# Patient Record
Sex: Male | Born: 1935 | Race: White | Hispanic: No | Marital: Married | Smoking: Current every day smoker
Health system: Southern US, Community
[De-identification: ages and names within clinical notes are randomized; demographics above are authoritative.]

## PROBLEM LIST (undated history)

## (undated) DIAGNOSIS — R1310 Dysphagia, unspecified: Secondary | ICD-10-CM

## (undated) DIAGNOSIS — J3800 Paralysis of vocal cords and larynx, unspecified: Secondary | ICD-10-CM

## (undated) DIAGNOSIS — I10 Essential (primary) hypertension: Secondary | ICD-10-CM

## (undated) DIAGNOSIS — J449 Chronic obstructive pulmonary disease, unspecified: Secondary | ICD-10-CM

## (undated) DIAGNOSIS — I499 Cardiac arrhythmia, unspecified: Secondary | ICD-10-CM

## (undated) DIAGNOSIS — K219 Gastro-esophageal reflux disease without esophagitis: Secondary | ICD-10-CM

## (undated) DIAGNOSIS — C801 Malignant (primary) neoplasm, unspecified: Secondary | ICD-10-CM

## (undated) DIAGNOSIS — I639 Cerebral infarction, unspecified: Secondary | ICD-10-CM

## (undated) DIAGNOSIS — E785 Hyperlipidemia, unspecified: Secondary | ICD-10-CM

## (undated) DIAGNOSIS — Z87442 Personal history of urinary calculi: Secondary | ICD-10-CM

## (undated) DIAGNOSIS — M1990 Unspecified osteoarthritis, unspecified site: Secondary | ICD-10-CM

## (undated) HISTORY — DX: Gastro-esophageal reflux disease without esophagitis: K21.9

## (undated) HISTORY — PX: OTHER SURGICAL HISTORY: SHX169

## (undated) HISTORY — PX: PEG TUBE PLACEMENT: SUR1034

## (undated) HISTORY — DX: Hyperlipidemia, unspecified: E78.5

## (undated) HISTORY — PX: APPENDECTOMY: SHX54

## (undated) HISTORY — PX: HERNIA REPAIR: SHX51

## (undated) HISTORY — DX: Cerebral infarction, unspecified: I63.9

## (undated) HISTORY — DX: Essential (primary) hypertension: I10

## (undated) HISTORY — DX: Malignant (primary) neoplasm, unspecified: C80.1

---

## 2006-03-06 ENCOUNTER — Other Ambulatory Visit: Payer: Self-pay

## 2006-03-06 ENCOUNTER — Inpatient Hospital Stay: Payer: Self-pay | Admitting: Internal Medicine

## 2006-06-20 ENCOUNTER — Encounter: Payer: Self-pay | Admitting: Family Medicine

## 2006-06-27 ENCOUNTER — Encounter: Payer: Self-pay | Admitting: Family Medicine

## 2007-03-18 ENCOUNTER — Ambulatory Visit: Payer: Self-pay | Admitting: Cardiovascular Disease

## 2007-03-27 ENCOUNTER — Ambulatory Visit: Payer: Self-pay | Admitting: Family Medicine

## 2007-04-30 ENCOUNTER — Ambulatory Visit: Payer: Self-pay

## 2008-11-02 ENCOUNTER — Ambulatory Visit: Payer: Self-pay | Admitting: Family Medicine

## 2008-11-08 ENCOUNTER — Ambulatory Visit: Payer: Self-pay | Admitting: Family Medicine

## 2009-04-19 ENCOUNTER — Ambulatory Visit: Payer: Self-pay | Admitting: Family Medicine

## 2011-01-02 ENCOUNTER — Ambulatory Visit: Payer: Self-pay | Admitting: Family Medicine

## 2011-11-11 DIAGNOSIS — C649 Malignant neoplasm of unspecified kidney, except renal pelvis: Secondary | ICD-10-CM | POA: Insufficient documentation

## 2011-12-11 DIAGNOSIS — N1330 Unspecified hydronephrosis: Secondary | ICD-10-CM | POA: Insufficient documentation

## 2012-02-21 DIAGNOSIS — N400 Enlarged prostate without lower urinary tract symptoms: Secondary | ICD-10-CM | POA: Insufficient documentation

## 2012-02-24 DIAGNOSIS — I4891 Unspecified atrial fibrillation: Secondary | ICD-10-CM | POA: Insufficient documentation

## 2012-04-02 ENCOUNTER — Ambulatory Visit: Payer: Self-pay | Admitting: Family Medicine

## 2012-05-26 ENCOUNTER — Ambulatory Visit: Payer: Self-pay | Admitting: Family Medicine

## 2012-07-14 ENCOUNTER — Ambulatory Visit: Payer: Self-pay | Admitting: Family Medicine

## 2012-07-14 LAB — CBC AND DIFFERENTIAL: HEMOGLOBIN: 14.6 g/dL (ref 13.5–17.5)

## 2012-10-27 DIAGNOSIS — J90 Pleural effusion, not elsewhere classified: Secondary | ICD-10-CM | POA: Insufficient documentation

## 2012-12-10 DIAGNOSIS — F09 Unspecified mental disorder due to known physiological condition: Secondary | ICD-10-CM | POA: Insufficient documentation

## 2012-12-10 DIAGNOSIS — F325 Major depressive disorder, single episode, in full remission: Secondary | ICD-10-CM | POA: Insufficient documentation

## 2013-06-07 DIAGNOSIS — R339 Retention of urine, unspecified: Secondary | ICD-10-CM | POA: Diagnosis not present

## 2013-06-16 DIAGNOSIS — E785 Hyperlipidemia, unspecified: Secondary | ICD-10-CM | POA: Diagnosis not present

## 2013-06-16 DIAGNOSIS — Z7901 Long term (current) use of anticoagulants: Secondary | ICD-10-CM | POA: Diagnosis not present

## 2013-06-16 DIAGNOSIS — I10 Essential (primary) hypertension: Secondary | ICD-10-CM | POA: Diagnosis not present

## 2013-06-16 DIAGNOSIS — K219 Gastro-esophageal reflux disease without esophagitis: Secondary | ICD-10-CM | POA: Diagnosis not present

## 2013-06-16 LAB — HEPATIC FUNCTION PANEL
ALT: 7 U/L — AB (ref 10–40)
AST: 11 U/L — AB (ref 14–40)

## 2013-07-27 DIAGNOSIS — Z7901 Long term (current) use of anticoagulants: Secondary | ICD-10-CM | POA: Diagnosis not present

## 2013-08-12 DIAGNOSIS — Z7901 Long term (current) use of anticoagulants: Secondary | ICD-10-CM | POA: Diagnosis not present

## 2013-09-09 DIAGNOSIS — Z7901 Long term (current) use of anticoagulants: Secondary | ICD-10-CM | POA: Diagnosis not present

## 2013-09-20 DIAGNOSIS — N209 Urinary calculus, unspecified: Secondary | ICD-10-CM | POA: Diagnosis not present

## 2013-09-20 DIAGNOSIS — R339 Retention of urine, unspecified: Secondary | ICD-10-CM | POA: Diagnosis not present

## 2013-09-20 DIAGNOSIS — C649 Malignant neoplasm of unspecified kidney, except renal pelvis: Secondary | ICD-10-CM | POA: Diagnosis not present

## 2013-10-05 DIAGNOSIS — N411 Chronic prostatitis: Secondary | ICD-10-CM | POA: Diagnosis not present

## 2013-10-05 DIAGNOSIS — N400 Enlarged prostate without lower urinary tract symptoms: Secondary | ICD-10-CM | POA: Diagnosis not present

## 2013-10-05 DIAGNOSIS — Z7901 Long term (current) use of anticoagulants: Secondary | ICD-10-CM | POA: Diagnosis not present

## 2013-10-05 DIAGNOSIS — R350 Frequency of micturition: Secondary | ICD-10-CM | POA: Diagnosis not present

## 2013-10-19 DIAGNOSIS — Z7901 Long term (current) use of anticoagulants: Secondary | ICD-10-CM | POA: Diagnosis not present

## 2013-10-26 DIAGNOSIS — Z7901 Long term (current) use of anticoagulants: Secondary | ICD-10-CM | POA: Diagnosis not present

## 2013-11-09 DIAGNOSIS — Z7901 Long term (current) use of anticoagulants: Secondary | ICD-10-CM | POA: Diagnosis not present

## 2013-12-09 DIAGNOSIS — Z7901 Long term (current) use of anticoagulants: Secondary | ICD-10-CM | POA: Diagnosis not present

## 2013-12-28 DIAGNOSIS — Z7901 Long term (current) use of anticoagulants: Secondary | ICD-10-CM | POA: Diagnosis not present

## 2014-01-17 DIAGNOSIS — Z7901 Long term (current) use of anticoagulants: Secondary | ICD-10-CM | POA: Diagnosis not present

## 2014-02-09 DIAGNOSIS — Z7901 Long term (current) use of anticoagulants: Secondary | ICD-10-CM | POA: Diagnosis not present

## 2014-03-14 DIAGNOSIS — Z7901 Long term (current) use of anticoagulants: Secondary | ICD-10-CM | POA: Diagnosis not present

## 2014-04-11 DIAGNOSIS — I639 Cerebral infarction, unspecified: Secondary | ICD-10-CM | POA: Diagnosis not present

## 2014-04-18 ENCOUNTER — Ambulatory Visit: Payer: Self-pay | Admitting: Family Medicine

## 2014-04-18 DIAGNOSIS — R05 Cough: Secondary | ICD-10-CM | POA: Diagnosis not present

## 2014-04-18 DIAGNOSIS — R0989 Other specified symptoms and signs involving the circulatory and respiratory systems: Secondary | ICD-10-CM | POA: Diagnosis not present

## 2014-05-03 DIAGNOSIS — J948 Other specified pleural conditions: Secondary | ICD-10-CM | POA: Diagnosis not present

## 2014-05-23 DIAGNOSIS — Z7901 Long term (current) use of anticoagulants: Secondary | ICD-10-CM | POA: Diagnosis not present

## 2014-05-23 DIAGNOSIS — Z905 Acquired absence of kidney: Secondary | ICD-10-CM | POA: Diagnosis not present

## 2014-05-23 DIAGNOSIS — N200 Calculus of kidney: Secondary | ICD-10-CM | POA: Diagnosis not present

## 2014-05-23 DIAGNOSIS — M47815 Spondylosis without myelopathy or radiculopathy, thoracolumbar region: Secondary | ICD-10-CM | POA: Diagnosis not present

## 2014-05-23 DIAGNOSIS — R339 Retention of urine, unspecified: Secondary | ICD-10-CM | POA: Diagnosis not present

## 2014-05-23 DIAGNOSIS — E278 Other specified disorders of adrenal gland: Secondary | ICD-10-CM | POA: Diagnosis not present

## 2014-05-23 DIAGNOSIS — Z79899 Other long term (current) drug therapy: Secondary | ICD-10-CM | POA: Diagnosis not present

## 2014-05-23 DIAGNOSIS — N400 Enlarged prostate without lower urinary tract symptoms: Secondary | ICD-10-CM | POA: Diagnosis not present

## 2014-05-23 DIAGNOSIS — I4891 Unspecified atrial fibrillation: Secondary | ICD-10-CM | POA: Diagnosis not present

## 2014-05-23 DIAGNOSIS — K5730 Diverticulosis of large intestine without perforation or abscess without bleeding: Secondary | ICD-10-CM | POA: Diagnosis not present

## 2014-05-23 DIAGNOSIS — Z87891 Personal history of nicotine dependence: Secondary | ICD-10-CM | POA: Diagnosis not present

## 2014-05-23 DIAGNOSIS — J449 Chronic obstructive pulmonary disease, unspecified: Secondary | ICD-10-CM | POA: Diagnosis not present

## 2014-05-23 DIAGNOSIS — K8020 Calculus of gallbladder without cholecystitis without obstruction: Secondary | ICD-10-CM | POA: Diagnosis not present

## 2014-05-23 DIAGNOSIS — N3090 Cystitis, unspecified without hematuria: Secondary | ICD-10-CM | POA: Diagnosis not present

## 2014-05-23 DIAGNOSIS — K828 Other specified diseases of gallbladder: Secondary | ICD-10-CM | POA: Diagnosis not present

## 2014-05-23 DIAGNOSIS — F0390 Unspecified dementia without behavioral disturbance: Secondary | ICD-10-CM | POA: Diagnosis not present

## 2014-06-08 DIAGNOSIS — N411 Chronic prostatitis: Secondary | ICD-10-CM | POA: Diagnosis not present

## 2014-06-08 DIAGNOSIS — Z7901 Long term (current) use of anticoagulants: Secondary | ICD-10-CM | POA: Diagnosis not present

## 2014-06-08 DIAGNOSIS — Z09 Encounter for follow-up examination after completed treatment for conditions other than malignant neoplasm: Secondary | ICD-10-CM | POA: Diagnosis not present

## 2014-06-08 DIAGNOSIS — K219 Gastro-esophageal reflux disease without esophagitis: Secondary | ICD-10-CM | POA: Diagnosis not present

## 2014-06-08 DIAGNOSIS — M5135 Other intervertebral disc degeneration, thoracolumbar region: Secondary | ICD-10-CM | POA: Diagnosis not present

## 2014-06-08 DIAGNOSIS — I10 Essential (primary) hypertension: Secondary | ICD-10-CM | POA: Diagnosis not present

## 2014-06-08 DIAGNOSIS — E784 Other hyperlipidemia: Secondary | ICD-10-CM | POA: Diagnosis not present

## 2014-06-08 DIAGNOSIS — R310 Gross hematuria: Secondary | ICD-10-CM | POA: Diagnosis not present

## 2014-06-08 DIAGNOSIS — N401 Enlarged prostate with lower urinary tract symptoms: Secondary | ICD-10-CM | POA: Diagnosis not present

## 2014-06-08 LAB — BASIC METABOLIC PANEL
BUN: 12 mg/dL (ref 4–21)
CREATININE: 1.8 mg/dL — AB (ref <=1.3)
Glucose: 159 mg/dL
Potassium: 3.8 mmol/L (ref 3.4–5.3)
Sodium: 144 mmol/L (ref 137–147)

## 2014-06-08 LAB — LIPID PANEL
Cholesterol: 106 mg/dL (ref 0–200)
HDL: 38 mg/dL (ref 35–70)
LDL Cholesterol: 40 mg/dL
Triglycerides: 138 mg/dL (ref 40–160)

## 2014-07-01 DIAGNOSIS — N390 Urinary tract infection, site not specified: Secondary | ICD-10-CM | POA: Diagnosis not present

## 2014-07-01 DIAGNOSIS — Z85528 Personal history of other malignant neoplasm of kidney: Secondary | ICD-10-CM | POA: Diagnosis not present

## 2014-07-01 DIAGNOSIS — Z905 Acquired absence of kidney: Secondary | ICD-10-CM | POA: Diagnosis not present

## 2014-07-19 DIAGNOSIS — Z7901 Long term (current) use of anticoagulants: Secondary | ICD-10-CM | POA: Diagnosis not present

## 2014-08-08 DIAGNOSIS — Z7901 Long term (current) use of anticoagulants: Secondary | ICD-10-CM | POA: Diagnosis not present

## 2014-08-29 DIAGNOSIS — N401 Enlarged prostate with lower urinary tract symptoms: Secondary | ICD-10-CM | POA: Diagnosis not present

## 2014-09-22 DIAGNOSIS — Z7901 Long term (current) use of anticoagulants: Secondary | ICD-10-CM | POA: Diagnosis not present

## 2014-10-27 ENCOUNTER — Other Ambulatory Visit (INDEPENDENT_AMBULATORY_CARE_PROVIDER_SITE_OTHER): Payer: Medicare Other

## 2014-10-27 ENCOUNTER — Encounter (INDEPENDENT_AMBULATORY_CARE_PROVIDER_SITE_OTHER): Payer: Self-pay

## 2014-10-27 DIAGNOSIS — Z7901 Long term (current) use of anticoagulants: Secondary | ICD-10-CM

## 2014-10-27 DIAGNOSIS — R319 Hematuria, unspecified: Secondary | ICD-10-CM | POA: Diagnosis not present

## 2014-10-27 LAB — POCT URINALYSIS DIPSTICK
Bilirubin, UA: NEGATIVE
GLUCOSE UA: NEGATIVE
KETONES UA: NEGATIVE
Leukocytes, UA: NEGATIVE
Nitrite, UA: POSITIVE
Urobilinogen, UA: 0.2
pH, UA: 6

## 2014-10-28 LAB — PROTIME-INR
INR: 2.1 — ABNORMAL HIGH (ref 0.8–1.2)
Prothrombin Time: 21.7 s — ABNORMAL HIGH (ref 9.1–12.0)

## 2014-11-30 ENCOUNTER — Other Ambulatory Visit: Payer: Self-pay

## 2014-11-30 ENCOUNTER — Ambulatory Visit
Admission: RE | Admit: 2014-11-30 | Discharge: 2014-11-30 | Disposition: A | Discharge status: Home / Self Care | Payer: Medicare Other | Source: Ambulatory Visit | Attending: Family Medicine | Admitting: Family Medicine

## 2014-11-30 ENCOUNTER — Encounter: Payer: Self-pay | Admitting: Family Medicine

## 2014-11-30 ENCOUNTER — Ambulatory Visit (INDEPENDENT_AMBULATORY_CARE_PROVIDER_SITE_OTHER): Payer: Medicare Other | Admitting: Family Medicine

## 2014-11-30 VITALS — BP 108/64 | HR 64 | Ht 70.0 in | Wt 188.0 lb

## 2014-11-30 DIAGNOSIS — M5136 Other intervertebral disc degeneration, lumbar region: Secondary | ICD-10-CM | POA: Insufficient documentation

## 2014-11-30 DIAGNOSIS — G8929 Other chronic pain: Secondary | ICD-10-CM | POA: Diagnosis not present

## 2014-11-30 DIAGNOSIS — I4891 Unspecified atrial fibrillation: Secondary | ICD-10-CM | POA: Diagnosis not present

## 2014-11-30 DIAGNOSIS — M549 Dorsalgia, unspecified: Secondary | ICD-10-CM

## 2014-11-30 DIAGNOSIS — E785 Hyperlipidemia, unspecified: Secondary | ICD-10-CM | POA: Insufficient documentation

## 2014-11-30 DIAGNOSIS — N3090 Cystitis, unspecified without hematuria: Secondary | ICD-10-CM

## 2014-11-30 DIAGNOSIS — I10 Essential (primary) hypertension: Secondary | ICD-10-CM | POA: Insufficient documentation

## 2014-11-30 DIAGNOSIS — M1388 Other specified arthritis, other site: Secondary | ICD-10-CM | POA: Insufficient documentation

## 2014-11-30 DIAGNOSIS — K219 Gastro-esophageal reflux disease without esophagitis: Secondary | ICD-10-CM | POA: Insufficient documentation

## 2014-11-30 DIAGNOSIS — N200 Calculus of kidney: Secondary | ICD-10-CM | POA: Insufficient documentation

## 2014-11-30 LAB — POCT URINALYSIS DIPSTICK
BILIRUBIN UA: NEGATIVE
Blood, UA: NEGATIVE
GLUCOSE UA: NEGATIVE
Ketones, UA: NEGATIVE
NITRITE UA: NEGATIVE
Spec Grav, UA: 1.02
Urobilinogen, UA: 0.2
pH, UA: 6

## 2014-11-30 MED ORDER — FUROSEMIDE 20 MG PO TABS: 20.0000 mg | ORAL_TABLET | Freq: Every day | ORAL | Status: DC

## 2014-11-30 MED ORDER — RANITIDINE HCL 300 MG PO TABS: 300.0000 mg | ORAL_TABLET | Freq: Every day | ORAL | Status: DC

## 2014-11-30 MED ORDER — WARFARIN SODIUM 1 MG PO TABS: 1.0000 mg | ORAL_TABLET | Freq: Every day | ORAL | Status: DC

## 2014-11-30 MED ORDER — ATORVASTATIN CALCIUM 40 MG PO TABS: 40.0000 mg | ORAL_TABLET | Freq: Every day | ORAL | Status: DC

## 2014-11-30 MED ORDER — DILTIAZEM HCL 60 MG PO TABS: 60.0000 mg | ORAL_TABLET | Freq: Two times a day (BID) | ORAL | Status: DC

## 2014-11-30 MED ORDER — SULFAMETHOXAZOLE-TRIMETHOPRIM 800-160 MG PO TABS: 1.0000 | ORAL_TABLET | Freq: Two times a day (BID) | ORAL | Status: DC

## 2014-11-30 NOTE — Progress Notes (Signed)
Name: Shane Simmons   MRN: [id number]    DOB: December 30, 1935   Date:11/30/2014       Progress Note  Subjective  Chief Complaint  Chief Complaint  Patient presents with  . Hypertension  . Gastrophageal Reflux  . Hyperlipidemia  . Anxiety  . Cerebrovascular Accident    on coumadin    Hypertension This is a chronic problem. The current episode started more than 1 year ago. The problem has been waxing and waning since onset. The problem is controlled. Pertinent negatives include no blurred vision, chest pain, headaches, malaise/fatigue, neck pain, orthopnea, peripheral edema, PND or sweats. There are no associated agents to hypertension. There are no known risk factors for coronary artery disease. Past treatments include nothing. The current treatment provides moderate improvement. There are no compliance problems.  There is no history of angina, kidney disease, CAD/MI, CVA, heart failure, left ventricular hypertrophy, PVD or retinopathy. There is no history of chronic renal disease.  Gastrophageal Reflux He reports no abdominal pain, no belching, no chest pain, no choking, no coughing, no early satiety, no globus sensation, no heartburn, no hoarse voice, no sore throat, no stridor, no tooth decay, no water brash or no wheezing. This is a recurrent problem. The current episode started more than 1 year ago. The problem occurs occasionally. Nothing aggravates the symptoms. Pertinent negatives include no anemia, fatigue, melena, muscle weakness, orthopnea or weight loss. There are no known risk factors. He has tried nothing for the symptoms.  Hyperlipidemia This is a recurrent problem. The current episode started more than 1 year ago. The problem is controlled. Exacerbating diseases include obesity. He has no history of chronic renal disease, diabetes or hypothyroidism. There are no known factors aggravating his hyperlipidemia. Pertinent negatives include no chest pain, focal sensory loss, focal  weakness or leg pain. Current antihyperlipidemic treatment includes statins. The current treatment provides mild improvement of lipids. There are no compliance problems.   Cerebrovascular Accident This is a chronic problem. The current episode started more than 1 year ago. The problem has been unchanged. Pertinent negatives include no abdominal pain, chest pain, coughing, fatigue, headaches, neck pain or sore throat. Nothing aggravates the symptoms. He has tried nothing for the symptoms.  Back Pain This is a chronic problem. The current episode started more than 1 year ago. The problem occurs constantly. The problem is unchanged. The pain is present in the lumbar spine. The quality of the pain is described as aching. The pain is moderate. The pain is worse during the day. The symptoms are aggravated by bending and twisting. Pertinent negatives include no abdominal pain, bladder incontinence, bowel incontinence, chest pain, headaches, leg pain or weight loss. He has tried nothing for the symptoms.    No problem-specific assessment & plan notes found for this encounter.   No past medical history on file.  Past Surgical History  Procedure Laterality Date  . Kidney removed      due to cancer  . Appendectomy      No family history on file.  History   Social History  . Marital Status: Married    Spouse Name: N/A  . Number of Children: N/A  . Years of Education: N/A   Occupational History  . Not on file.   Social History Main Topics  . Smoking status: Former Research scientist (life sciences)  . Smokeless tobacco: Not on file  . Alcohol Use: No  . Drug Use: No  . Sexual Activity: Not on file   Other Topics  Concern  . Not on file   Social History Narrative  . No narrative on file    No Known Allergies   Review of Systems  Constitutional: Negative for weight loss, malaise/fatigue and fatigue.  HENT: Negative for hoarse voice and sore throat.   Eyes: Negative for blurred vision.  Respiratory: Negative  for cough, choking and wheezing.   Cardiovascular: Negative for chest pain, orthopnea and PND.  Gastrointestinal: Negative for heartburn, abdominal pain, melena and bowel incontinence.  Genitourinary: Negative for bladder incontinence.  Musculoskeletal: Positive for back pain. Negative for muscle weakness and neck pain.  Neurological: Negative for focal weakness and headaches.     Objective  Filed Vitals:   11/30/14 1015  BP: 108/64  Pulse: 64  Height: 5\' 10"  (1.778 m)  Weight: 188 lb (85.276 kg)    Physical Exam    Assessment & Plan  Problem List Items Addressed This Visit      Cardiovascular and Mediastinum   A-fib - Primary   Relevant Medications   atorvastatin (LIPITOR) 40 MG tablet   diltiazem (CARDIZEM) 60 MG tablet   furosemide (LASIX) 20 MG tablet   warfarin (COUMADIN) 1 MG tablet   Hypertension   Relevant Medications   atorvastatin (LIPITOR) 40 MG tablet   diltiazem (CARDIZEM) 60 MG tablet   furosemide (LASIX) 20 MG tablet   warfarin (COUMADIN) 1 MG tablet   Other Relevant Orders   POCT Urinalysis Dipstick   Renal Function Panel     Digestive   GERD (gastroesophageal reflux disease)   Relevant Medications   ranitidine (ZANTAC) 300 MG tablet     Other   Hyperlipidemia   Relevant Medications   atorvastatin (LIPITOR) 40 MG tablet   diltiazem (CARDIZEM) 60 MG tablet   furosemide (LASIX) 20 MG tablet   warfarin (COUMADIN) 1 MG tablet   Other Relevant Orders   Lipid Profile   Chronic back pain        Dr. Deanna Jones [facility]  11/30/2014

## 2014-11-30 NOTE — Addendum Note (Signed)
Addended by: Fredderick Severance on: 11/30/2014 11:43 AM   Modules accepted: Orders

## 2014-12-01 LAB — RENAL FUNCTION PANEL
ALBUMIN: 3.8 g/dL (ref 3.5–4.8)
BUN/Creatinine Ratio: 10 (ref 10–22)
BUN: 14 mg/dL (ref 8–27)
CALCIUM: 9 mg/dL (ref 8.6–10.2)
CO2: 24 mmol/L (ref 18–29)
CREATININE: 1.45 mg/dL — AB (ref 0.76–1.27)
Chloride: 102 mmol/L (ref 97–108)
GFR calc Af Amer: 53 mL/min/{1.73_m2} — ABNORMAL LOW (ref >59)
GFR, EST NON AFRICAN AMERICAN: 46 mL/min/{1.73_m2} — AB (ref >59)
Glucose: 84 mg/dL (ref 65–99)
Phosphorus: 2.7 mg/dL (ref 2.5–4.5)
Potassium: 3.5 mmol/L (ref 3.5–5.2)
SODIUM: 143 mmol/L (ref 134–144)

## 2014-12-01 LAB — LIPID PANEL
CHOL/HDL RATIO: 2.8 ratio (ref 0.0–5.0)
Cholesterol, Total: 110 mg/dL (ref 100–199)
HDL: 39 mg/dL — ABNORMAL LOW (ref >39)
LDL Calculated: 42 mg/dL (ref 0–99)
Triglycerides: 144 mg/dL (ref 0–149)
VLDL Cholesterol Cal: 29 mg/dL (ref 5–40)

## 2014-12-01 LAB — PROTIME-INR
INR: 3.3 — ABNORMAL HIGH (ref 0.8–1.2)
PROTHROMBIN TIME: 34.2 s — AB (ref 9.1–12.0)

## 2014-12-08 ENCOUNTER — Other Ambulatory Visit: Payer: Medicare Other

## 2014-12-08 DIAGNOSIS — I4891 Unspecified atrial fibrillation: Secondary | ICD-10-CM

## 2014-12-09 ENCOUNTER — Other Ambulatory Visit: Payer: Self-pay

## 2014-12-09 LAB — PROTIME-INR
INR: 5.1 — ABNORMAL HIGH (ref 0.8–1.2)
PROTHROMBIN TIME: 53.9 s — AB (ref 9.1–12.0)

## 2014-12-15 DIAGNOSIS — F3341 Major depressive disorder, recurrent, in partial remission: Secondary | ICD-10-CM | POA: Diagnosis not present

## 2014-12-19 ENCOUNTER — Other Ambulatory Visit: Payer: Self-pay

## 2014-12-22 ENCOUNTER — Other Ambulatory Visit: Payer: Medicare Other

## 2014-12-22 DIAGNOSIS — I4891 Unspecified atrial fibrillation: Secondary | ICD-10-CM

## 2014-12-23 LAB — PROTIME-INR
INR: 1.9 — ABNORMAL HIGH (ref 0.8–1.2)
PROTHROMBIN TIME: 20.2 s — AB (ref 9.1–12.0)

## 2014-12-29 ENCOUNTER — Other Ambulatory Visit: Payer: Medicare Other

## 2014-12-29 DIAGNOSIS — I4891 Unspecified atrial fibrillation: Secondary | ICD-10-CM | POA: Diagnosis not present

## 2014-12-30 LAB — PROTIME-INR
INR: 2.1 — ABNORMAL HIGH (ref 0.8–1.2)
Prothrombin Time: 21.4 s — ABNORMAL HIGH (ref 9.1–12.0)

## 2015-01-25 ENCOUNTER — Other Ambulatory Visit: Payer: Medicare Other

## 2015-01-25 DIAGNOSIS — I4891 Unspecified atrial fibrillation: Secondary | ICD-10-CM

## 2015-01-26 LAB — PROTIME-INR
INR: 2.1 — AB (ref 0.8–1.2)
PROTHROMBIN TIME: 21.5 s — AB (ref 9.1–12.0)

## 2015-03-09 ENCOUNTER — Other Ambulatory Visit: Payer: Medicare Other

## 2015-03-09 DIAGNOSIS — N3090 Cystitis, unspecified without hematuria: Secondary | ICD-10-CM

## 2015-03-09 DIAGNOSIS — Z7901 Long term (current) use of anticoagulants: Secondary | ICD-10-CM | POA: Diagnosis not present

## 2015-03-09 MED ORDER — SULFAMETHOXAZOLE-TRIMETHOPRIM 800-160 MG PO TABS: 1.0000 | ORAL_TABLET | Freq: Two times a day (BID) | ORAL | Status: DC

## 2015-03-09 NOTE — Addendum Note (Signed)
Addended by: Fredderick Severance on: 03/09/2015 04:16 PM   Modules accepted: Orders

## 2015-03-10 LAB — PROTIME-INR
INR: 2.3 — ABNORMAL HIGH (ref 0.8–1.2)
PROTHROMBIN TIME: 23 s — AB (ref 9.1–12.0)

## 2015-04-26 ENCOUNTER — Other Ambulatory Visit: Payer: Medicare Other

## 2015-04-26 DIAGNOSIS — Z7901 Long term (current) use of anticoagulants: Secondary | ICD-10-CM | POA: Diagnosis not present

## 2015-04-27 LAB — PROTIME-INR
INR: 1.6 — ABNORMAL HIGH (ref 0.8–1.2)
Prothrombin Time: 16.9 s — ABNORMAL HIGH (ref 9.1–12.0)

## 2015-05-03 DIAGNOSIS — R339 Retention of urine, unspecified: Secondary | ICD-10-CM | POA: Diagnosis not present

## 2015-05-03 DIAGNOSIS — N400 Enlarged prostate without lower urinary tract symptoms: Secondary | ICD-10-CM | POA: Diagnosis not present

## 2015-05-03 DIAGNOSIS — Z87440 Personal history of urinary (tract) infections: Secondary | ICD-10-CM | POA: Diagnosis not present

## 2015-05-03 DIAGNOSIS — Z85528 Personal history of other malignant neoplasm of kidney: Secondary | ICD-10-CM | POA: Diagnosis not present

## 2015-05-03 DIAGNOSIS — Z79899 Other long term (current) drug therapy: Secondary | ICD-10-CM | POA: Diagnosis not present

## 2015-05-03 DIAGNOSIS — N319 Neuromuscular dysfunction of bladder, unspecified: Secondary | ICD-10-CM | POA: Diagnosis not present

## 2015-05-03 DIAGNOSIS — Z905 Acquired absence of kidney: Secondary | ICD-10-CM | POA: Diagnosis not present

## 2015-05-03 DIAGNOSIS — Z7901 Long term (current) use of anticoagulants: Secondary | ICD-10-CM | POA: Diagnosis not present

## 2015-05-03 DIAGNOSIS — F09 Unspecified mental disorder due to known physiological condition: Secondary | ICD-10-CM | POA: Diagnosis not present

## 2015-05-03 DIAGNOSIS — F325 Major depressive disorder, single episode, in full remission: Secondary | ICD-10-CM | POA: Diagnosis not present

## 2015-05-03 DIAGNOSIS — I4891 Unspecified atrial fibrillation: Secondary | ICD-10-CM | POA: Diagnosis not present

## 2015-05-15 ENCOUNTER — Other Ambulatory Visit: Payer: Medicare Other

## 2015-05-15 DIAGNOSIS — Z7901 Long term (current) use of anticoagulants: Secondary | ICD-10-CM | POA: Diagnosis not present

## 2015-05-16 LAB — PROTIME-INR
INR: 1.7 — ABNORMAL HIGH (ref 0.8–1.2)
Prothrombin Time: 17.6 s — ABNORMAL HIGH (ref 9.1–12.0)

## 2015-06-08 ENCOUNTER — Other Ambulatory Visit: Payer: Medicare Other

## 2015-06-08 DIAGNOSIS — Z7901 Long term (current) use of anticoagulants: Secondary | ICD-10-CM | POA: Diagnosis not present

## 2015-06-09 LAB — PROTIME-INR
INR: 2.8 — ABNORMAL HIGH (ref 0.8–1.2)
PROTHROMBIN TIME: 28.2 s — AB (ref 9.1–12.0)

## 2015-06-22 ENCOUNTER — Other Ambulatory Visit: Payer: Self-pay | Admitting: Family Medicine

## 2015-06-22 DIAGNOSIS — F3341 Major depressive disorder, recurrent, in partial remission: Secondary | ICD-10-CM | POA: Diagnosis not present

## 2015-07-21 ENCOUNTER — Other Ambulatory Visit
Admission: RE | Admit: 2015-07-21 | Discharge: 2015-07-21 | Disposition: A | Discharge status: Home / Self Care | Payer: Medicare Other | Source: Ambulatory Visit | Attending: Family Medicine | Admitting: Family Medicine

## 2015-07-21 ENCOUNTER — Ambulatory Visit
Admission: RE | Admit: 2015-07-21 | Discharge: 2015-07-21 | Disposition: A | Discharge status: Home / Self Care | Payer: Medicare Other | Source: Ambulatory Visit | Attending: Family Medicine | Admitting: Family Medicine

## 2015-07-21 ENCOUNTER — Encounter: Payer: Self-pay | Admitting: Family Medicine

## 2015-07-21 ENCOUNTER — Ambulatory Visit (INDEPENDENT_AMBULATORY_CARE_PROVIDER_SITE_OTHER): Payer: Medicare Other | Admitting: Family Medicine

## 2015-07-21 VITALS — BP 90/62 | HR 92 | Temp 98.5°F | Ht 70.0 in | Wt 188.0 lb

## 2015-07-21 DIAGNOSIS — N3000 Acute cystitis without hematuria: Secondary | ICD-10-CM

## 2015-07-21 DIAGNOSIS — R05 Cough: Secondary | ICD-10-CM | POA: Diagnosis not present

## 2015-07-21 DIAGNOSIS — R059 Cough, unspecified: Secondary | ICD-10-CM

## 2015-07-21 DIAGNOSIS — J0100 Acute maxillary sinusitis, unspecified: Secondary | ICD-10-CM

## 2015-07-21 DIAGNOSIS — J90 Pleural effusion, not elsewhere classified: Secondary | ICD-10-CM | POA: Diagnosis not present

## 2015-07-21 LAB — CBC WITH DIFFERENTIAL/PLATELET
BASOS ABS: 0.1 10*3/uL (ref 0–0.1)
Basophils Relative: 1 %
Eosinophils Absolute: 0.2 10*3/uL (ref 0–0.7)
Eosinophils Relative: 2 %
HEMATOCRIT: 50.8 % (ref 40.0–52.0)
Hemoglobin: 16.8 g/dL (ref 13.0–18.0)
LYMPHS PCT: 13 %
Lymphs Abs: 1.2 10*3/uL (ref 1.0–3.6)
MCH: 30.8 pg (ref 26.0–34.0)
MCHC: 33.2 g/dL (ref 32.0–36.0)
MCV: 93 fL (ref 80.0–100.0)
MONO ABS: 1.2 10*3/uL — AB (ref 0.2–1.0)
Monocytes Relative: 13 %
NEUTROS ABS: 6.6 10*3/uL — AB (ref 1.4–6.5)
Neutrophils Relative %: 71 %
Platelets: 224 10*3/uL (ref 150–440)
RBC: 5.47 MIL/uL (ref 4.40–5.90)
RDW: 14.4 % (ref 11.5–14.5)
WBC: 9.3 10*3/uL (ref 3.8–10.6)

## 2015-07-21 LAB — POCT URINALYSIS DIPSTICK
Bilirubin, UA: NEGATIVE
Glucose, UA: NEGATIVE
KETONES UA: NEGATIVE
Nitrite, UA: POSITIVE
PH UA: 7
Spec Grav, UA: 1.01
UROBILINOGEN UA: 0.2

## 2015-07-21 MED ORDER — LEVOFLOXACIN 500 MG PO TABS: 500.0000 mg | ORAL_TABLET | Freq: Every day | ORAL | Status: DC

## 2015-07-21 NOTE — Progress Notes (Signed)
Name: Shane Simmons   MRN: [id number]    DOB: 10/20/1935   Date:07/21/2015       Progress Note  Subjective  Chief Complaint  Chief Complaint  Patient presents with  . Sinusitis    cough and cong- some production  . Urinary Tract Infection    wants urine checked because "it stinks"    Sinusitis This is a new problem. The current episode started yesterday. The problem has been gradually worsening since onset. The maximum temperature recorded prior to his arrival was 100.4 - 100.9 F. Associated symptoms include congestion, coughing and sinus pressure. Pertinent negatives include no chills, diaphoresis, ear pain, headaches, hoarse voice, neck pain, shortness of breath, sneezing, sore throat or swollen glands. The treatment provided mild relief.  Urinary Tract Infection  This is a recurrent problem. The problem occurs intermittently. Associated symptoms include frequency. Pertinent negatives include no chills, discharge, hematuria, nausea or urgency. Associated symptoms comments: odor. The treatment provided no relief.    No problem-specific assessment & plan notes found for this encounter.   Past Medical History  Diagnosis Date  . GERD (gastroesophageal reflux disease)   . Cancer ([place])   . Stroke ([place])   . Hypertension   . Hyperlipidemia     Past Surgical History  Procedure Laterality Date  . Kidney removed      due to cancer  . Appendectomy      History reviewed. No pertinent family history.  Social History   Social History  . Marital Status: Married    Spouse Name: N/A  . Number of Children: N/A  . Years of Education: N/A   Occupational History  . Not on file.   Social History Main Topics  . Smoking status: Former Research scientist (life sciences)  . Smokeless tobacco: Not on file  . Alcohol Use: No  . Drug Use: No  . Sexual Activity: No   Other Topics Concern  . Not on file   Social History Narrative    No Known Allergies   Review of Systems  Constitutional: Negative for  fever, chills, weight loss, malaise/fatigue and diaphoresis.  HENT: Positive for congestion and sinus pressure. Negative for ear discharge, ear pain, hoarse voice, sneezing and sore throat.   Eyes: Negative for blurred vision.  Respiratory: Positive for cough. Negative for sputum production, shortness of breath and wheezing.   Cardiovascular: Negative for chest pain, palpitations and leg swelling.  Gastrointestinal: Negative for heartburn, nausea, abdominal pain, diarrhea, constipation, blood in stool and melena.  Genitourinary: Positive for frequency. Negative for dysuria, urgency and hematuria.  Musculoskeletal: Negative for myalgias, back pain, joint pain and neck pain.  Skin: Negative for rash.  Neurological: Negative for dizziness, tingling, sensory change, focal weakness and headaches.  Endo/Heme/Allergies: Negative for environmental allergies and polydipsia. Does not bruise/bleed easily.  Psychiatric/Behavioral: Negative for depression and suicidal ideas. The patient is not nervous/anxious and does not have insomnia.      Objective  Filed Vitals:   07/21/15 1432  BP: 90/62  Pulse: 92  Temp: 98.5 F (36.9 C)  TempSrc: Oral  Height: 5\' 10"  (1.778 m)  Weight: 188 lb (85.276 kg)  SpO2: 93%    Physical Exam  Constitutional: He is oriented to person, place, and time and well-developed, well-nourished, and in no distress.  HENT:  Head: Normocephalic.  Right Ear: Tympanic membrane, external ear and ear canal normal.  Left Ear: Tympanic membrane, external ear and ear canal normal.  Nose: Nose normal.  Mouth/Throat: Oropharynx is clear and  moist.  Eyes: Conjunctivae and EOM are normal. Pupils are equal, round, and reactive to light. Right eye exhibits no discharge. Left eye exhibits no discharge. No scleral icterus.  Neck: Normal range of motion. Neck supple. No JVD present. No tracheal deviation present. No thyromegaly present.  Cardiovascular: Normal rate, regular rhythm, normal  heart sounds and intact distal pulses.  Exam reveals no gallop and no friction rub.   No murmur heard. Pulmonary/Chest: Breath sounds normal. No respiratory distress. He has no wheezes. He has no rales.  Abdominal: Soft. Bowel sounds are normal. He exhibits no mass. There is no hepatosplenomegaly. There is no tenderness. There is no rebound, no guarding and no CVA tenderness.  Musculoskeletal: Normal range of motion. He exhibits no edema or tenderness.  Lymphadenopathy:    He has no cervical adenopathy.  Neurological: He is alert and oriented to person, place, and time. He has normal sensation, normal strength and intact cranial nerves. No cranial nerve deficit.  Skin: Skin is warm. No rash noted.  Psychiatric: Mood and affect normal.      Assessment & Plan  Problem List Items Addressed This Visit    None    Visit Diagnoses    Acute maxillary sinusitis, recurrence not specified    -  Primary    Relevant Medications    levofloxacin (LEVAQUIN) 500 MG tablet    Other Relevant Orders    DG Chest 2 View (Completed)    Acute cystitis without hematuria        Relevant Medications    levofloxacin (LEVAQUIN) 500 MG tablet    Other Relevant Orders    DG Chest 2 View (Completed)    POCT Urinalysis Dipstick (Completed)    Cough        Relevant Medications    levofloxacin (LEVAQUIN) 500 MG tablet    Other Relevant Orders    DG Chest 2 View (Completed)         Dr. Deanna Jones [facility]  07/21/2015

## 2015-08-01 ENCOUNTER — Other Ambulatory Visit: Payer: Medicare Other

## 2015-08-01 DIAGNOSIS — Z7901 Long term (current) use of anticoagulants: Secondary | ICD-10-CM

## 2015-08-02 LAB — PROTIME-INR
INR: 5.1 — AB (ref 0.8–1.2)
Prothrombin Time: 51.6 s — ABNORMAL HIGH (ref 9.1–12.0)

## 2015-08-18 ENCOUNTER — Encounter: Payer: Self-pay | Admitting: Family Medicine

## 2015-08-18 ENCOUNTER — Ambulatory Visit (INDEPENDENT_AMBULATORY_CARE_PROVIDER_SITE_OTHER): Payer: Medicare Other | Admitting: Family Medicine

## 2015-08-18 VITALS — BP 100/60 | HR 70 | Temp 98.1°F | Ht 70.0 in | Wt 188.0 lb

## 2015-08-18 DIAGNOSIS — H6123 Impacted cerumen, bilateral: Secondary | ICD-10-CM

## 2015-08-18 DIAGNOSIS — N3000 Acute cystitis without hematuria: Secondary | ICD-10-CM | POA: Diagnosis not present

## 2015-08-18 MED ORDER — SULFAMETHOXAZOLE-TRIMETHOPRIM 800-160 MG PO TABS: 1.0000 | ORAL_TABLET | Freq: Two times a day (BID) | ORAL | Status: DC

## 2015-08-18 NOTE — Progress Notes (Signed)
Name: Shane Simmons   MRN: [id number]    DOB: May 07, 1936   Date:08/18/2015       Progress Note  Subjective  Chief Complaint  Chief Complaint  Patient presents with  . Urinary Tract Infection    frequent urination    Urinary Tract Infection  This is a new problem. The current episode started in the past 7 days. The problem occurs intermittently. The problem has been gradually worsening. There has been no fever. Pertinent negatives include no chills, flank pain, frequency, hematuria, hesitancy, nausea or urgency. He has tried nothing for the symptoms. The treatment provided no relief.    No problem-specific assessment & plan notes found for this encounter.   Past Medical History  Diagnosis Date  . GERD (gastroesophageal reflux disease)   . Cancer ([place])   . Stroke ([place])   . Hypertension   . Hyperlipidemia     Past Surgical History  Procedure Laterality Date  . Kidney removed      due to cancer  . Appendectomy      No family history on file.  Social History   Social History  . Marital Status: Married    Spouse Name: N/A  . Number of Children: N/A  . Years of Education: N/A   Occupational History  . Not on file.   Social History Main Topics  . Smoking status: Former Research scientist (life sciences)  . Smokeless tobacco: Not on file  . Alcohol Use: No  . Drug Use: No  . Sexual Activity: No   Other Topics Concern  . Not on file   Social History Narrative    No Known Allergies   Review of Systems  Constitutional: Negative for fever, chills, weight loss and malaise/fatigue.  HENT: Negative for ear discharge, ear pain and sore throat.   Eyes: Negative for blurred vision.  Respiratory: Negative for cough, sputum production, shortness of breath and wheezing.   Cardiovascular: Negative for chest pain, palpitations and leg swelling.  Gastrointestinal: Negative for heartburn, nausea, abdominal pain, diarrhea, constipation, blood in stool and melena.  Genitourinary: Negative for  dysuria, hesitancy, urgency, frequency, hematuria and flank pain.  Musculoskeletal: Negative for myalgias, back pain, joint pain and neck pain.  Skin: Negative for rash.  Neurological: Negative for dizziness, tingling, sensory change, focal weakness and headaches.  Endo/Heme/Allergies: Negative for environmental allergies and polydipsia. Does not bruise/bleed easily.  Psychiatric/Behavioral: Negative for depression and suicidal ideas. The patient is not nervous/anxious and does not have insomnia.      Objective  Filed Vitals:   08/18/15 1435  BP: 100/60  Pulse: 70  Temp: 98.1 F (36.7 C)  TempSrc: Oral  Height: 5\' 10"  (1.778 m)  Weight: 188 lb (85.276 kg)    Physical Exam  Constitutional: He is oriented to person, place, and time and well-developed, well-nourished, and in no distress.  HENT:  Head: Normocephalic.  Right Ear: External ear normal.  Left Ear: External ear normal.  Nose: Nose normal.  Mouth/Throat: Oropharynx is clear and moist.  Eyes: Conjunctivae and EOM are normal. Pupils are equal, round, and reactive to light. Right eye exhibits no discharge. Left eye exhibits no discharge. No scleral icterus.  Neck: Normal range of motion. Neck supple. No JVD present. No tracheal deviation present. No thyromegaly present.  Cardiovascular: Normal rate, regular rhythm, normal heart sounds and intact distal pulses.  Exam reveals no gallop and no friction rub.   No murmur heard. Pulmonary/Chest: Breath sounds normal. No respiratory distress. He has no wheezes. He  has no rales.  Abdominal: Soft. Bowel sounds are normal. He exhibits no mass. There is no hepatosplenomegaly. There is no tenderness. There is no rebound, no guarding and no CVA tenderness.  Musculoskeletal: Normal range of motion. He exhibits no edema or tenderness.  Lymphadenopathy:    He has no cervical adenopathy.  Neurological: He is alert and oriented to person, place, and time. He has normal sensation, normal  strength, normal reflexes and intact cranial nerves. No cranial nerve deficit.  Skin: Skin is warm. No rash noted.  Psychiatric: Mood and affect normal.      Assessment & Plan  Problem List Items Addressed This Visit    None    Visit Diagnoses    Acute cystitis without hematuria    -  Primary    Relevant Medications    sulfamethoxazole-trimethoprim (BACTRIM DS,SEPTRA DS) 800-160 MG tablet    Cerumen impaction, bilateral          Pt couldn't get a urine sample/ unable to void   Dr. [facility]  08/18/2015

## 2015-08-24 ENCOUNTER — Other Ambulatory Visit: Payer: Self-pay | Admitting: Family Medicine

## 2015-08-24 ENCOUNTER — Ambulatory Visit (INDEPENDENT_AMBULATORY_CARE_PROVIDER_SITE_OTHER): Payer: Medicare Other | Admitting: Family Medicine

## 2015-08-24 ENCOUNTER — Encounter: Payer: Self-pay | Admitting: Family Medicine

## 2015-08-24 VITALS — BP 124/78 | HR 78 | Temp 97.3°F

## 2015-08-24 DIAGNOSIS — I4891 Unspecified atrial fibrillation: Secondary | ICD-10-CM

## 2015-08-24 DIAGNOSIS — J219 Acute bronchiolitis, unspecified: Secondary | ICD-10-CM | POA: Diagnosis not present

## 2015-08-24 MED ORDER — BENZONATATE 100 MG PO CAPS: 100.0000 mg | ORAL_CAPSULE | Freq: Two times a day (BID) | ORAL | Status: DC | PRN

## 2015-08-24 NOTE — Progress Notes (Signed)
Name: Shane Simmons   MRN: [id number]    DOB: 03/27/1936   Date:08/24/2015       Progress Note  Subjective  Chief Complaint  Chief Complaint  Patient presents with  . Follow-up    cough and uti- cleared up  . Coagulation Disorder    needs INR drawn    Cough This is a new problem. The current episode started yesterday. The problem has been gradually improving. The problem occurs constantly. The cough is non-productive. Associated symptoms include nasal congestion and postnasal drip. Pertinent negatives include no chest pain, chills, ear pain, fever, headaches, heartburn, hemoptysis, myalgias, rash, sore throat, shortness of breath, sweats, weight loss or wheezing. The symptoms are aggravated by pollens. He has tried nothing for the symptoms. The treatment provided mild relief. There is no history of asthma, bronchitis, COPD, emphysema or environmental allergies.    No problem-specific assessment & plan notes found for this encounter.   Past Medical History  Diagnosis Date  . GERD (gastroesophageal reflux disease)   . Cancer ([place])   . Stroke ([place])   . Hypertension   . Hyperlipidemia     Past Surgical History  Procedure Laterality Date  . Kidney removed      due to cancer  . Appendectomy      No family history on file.  Social History   Social History  . Marital Status: Married    Spouse Name: N/A  . Number of Children: N/A  . Years of Education: N/A   Occupational History  . Not on file.   Social History Main Topics  . Smoking status: Former Research scientist (life sciences)  . Smokeless tobacco: Not on file  . Alcohol Use: No  . Drug Use: No  . Sexual Activity: No   Other Topics Concern  . Not on file   Social History Narrative    No Known Allergies   Review of Systems  Constitutional: Negative for fever, chills, weight loss and malaise/fatigue.  HENT: Positive for postnasal drip. Negative for ear discharge, ear pain and sore throat.   Eyes: Negative for blurred vision.   Respiratory: Positive for cough. Negative for hemoptysis, sputum production, shortness of breath and wheezing.   Cardiovascular: Negative for chest pain, palpitations and leg swelling.  Gastrointestinal: Negative for heartburn, nausea, abdominal pain, diarrhea, constipation, blood in stool and melena.  Genitourinary: Negative for dysuria, urgency, frequency and hematuria.  Musculoskeletal: Negative for myalgias, back pain, joint pain and neck pain.  Skin: Negative for rash.  Neurological: Negative for dizziness, tingling, sensory change, focal weakness and headaches.  Endo/Heme/Allergies: Negative for environmental allergies and polydipsia. Does not bruise/bleed easily.  Psychiatric/Behavioral: Negative for depression and suicidal ideas. The patient is not nervous/anxious and does not have insomnia.      Objective  Filed Vitals:   08/24/15 1541  BP: 124/78  Pulse: 78  Temp: 97.3 F (36.3 C)  TempSrc: Oral    Physical Exam  Constitutional: He is oriented to person, place, and time and well-developed, well-nourished, and in no distress.  HENT:  Head: Normocephalic.  Right Ear: External ear normal.  Left Ear: External ear normal.  Nose: Nose normal.  Mouth/Throat: Oropharynx is clear and moist.  Eyes: Conjunctivae and EOM are normal. Pupils are equal, round, and reactive to light. Right eye exhibits no discharge. Left eye exhibits no discharge. No scleral icterus.  Neck: Normal range of motion. Neck supple. No JVD present. No tracheal deviation present. No thyromegaly present.  Cardiovascular: Normal rate, regular rhythm,  normal heart sounds and intact distal pulses.  Exam reveals no gallop and no friction rub.   No murmur heard. Pulmonary/Chest: Breath sounds normal. No respiratory distress. He has no wheezes. He has no rales.  Abdominal: Soft. Bowel sounds are normal. He exhibits no mass. There is no hepatosplenomegaly. There is no tenderness. There is no rebound, no guarding and  no CVA tenderness.  Musculoskeletal: Normal range of motion. He exhibits no edema or tenderness.  Lymphadenopathy:    He has no cervical adenopathy.  Neurological: He is alert and oriented to person, place, and time. He has normal sensation, normal strength, normal reflexes and intact cranial nerves. No cranial nerve deficit.  Skin: Skin is warm. No rash noted.  Psychiatric: Mood and affect normal.  Nursing note and vitals reviewed.     Assessment & Plan  Problem List Items Addressed This Visit      Cardiovascular and Mediastinum   A-fib ([place])   Relevant Orders   INR/PT    Other Visit Diagnoses    Bronchiolitis    -  Primary    Relevant Medications    benzonatate (TESSALON) 100 MG capsule         Dr. [facility]  08/24/2015

## 2015-08-25 LAB — PROTIME-INR
INR: 3.5 — ABNORMAL HIGH (ref 0.8–1.2)
PROTHROMBIN TIME: 35.3 s — AB (ref 9.1–12.0)

## 2015-10-26 ENCOUNTER — Other Ambulatory Visit: Payer: Self-pay | Admitting: Family Medicine

## 2015-11-20 DIAGNOSIS — R339 Retention of urine, unspecified: Secondary | ICD-10-CM | POA: Diagnosis not present

## 2015-11-20 DIAGNOSIS — N200 Calculus of kidney: Secondary | ICD-10-CM | POA: Diagnosis not present

## 2015-11-20 DIAGNOSIS — N390 Urinary tract infection, site not specified: Secondary | ICD-10-CM | POA: Diagnosis not present

## 2015-11-20 DIAGNOSIS — C641 Malignant neoplasm of right kidney, except renal pelvis: Secondary | ICD-10-CM | POA: Diagnosis not present

## 2015-11-22 ENCOUNTER — Other Ambulatory Visit: Payer: Medicare Other

## 2015-11-22 DIAGNOSIS — Z7901 Long term (current) use of anticoagulants: Secondary | ICD-10-CM | POA: Diagnosis not present

## 2015-11-23 LAB — PROTIME-INR
INR: 1.8 — ABNORMAL HIGH (ref 0.8–1.2)
PROTHROMBIN TIME: 18.6 s — AB (ref 9.1–12.0)

## 2015-11-30 ENCOUNTER — Other Ambulatory Visit: Payer: Medicare Other

## 2015-11-30 DIAGNOSIS — Z7901 Long term (current) use of anticoagulants: Secondary | ICD-10-CM

## 2015-12-01 LAB — PROTIME-INR
INR: 1.8 — ABNORMAL HIGH (ref 0.8–1.2)
Prothrombin Time: 18.3 s — ABNORMAL HIGH (ref 9.1–12.0)

## 2015-12-08 ENCOUNTER — Other Ambulatory Visit: Payer: Self-pay | Admitting: Family Medicine

## 2015-12-14 ENCOUNTER — Other Ambulatory Visit: Payer: Medicare Other

## 2015-12-14 DIAGNOSIS — D689 Coagulation defect, unspecified: Secondary | ICD-10-CM | POA: Diagnosis not present

## 2015-12-15 LAB — PROTIME-INR
INR: 2.8 — AB (ref 0.8–1.2)
PROTHROMBIN TIME: 28.8 s — AB (ref 9.1–12.0)

## 2015-12-22 ENCOUNTER — Other Ambulatory Visit: Payer: Self-pay | Admitting: Family Medicine

## 2015-12-22 DIAGNOSIS — I4891 Unspecified atrial fibrillation: Secondary | ICD-10-CM

## 2015-12-25 DIAGNOSIS — G8929 Other chronic pain: Secondary | ICD-10-CM | POA: Diagnosis not present

## 2015-12-25 DIAGNOSIS — M545 Low back pain: Secondary | ICD-10-CM | POA: Diagnosis not present

## 2015-12-28 ENCOUNTER — Other Ambulatory Visit: Payer: Self-pay | Admitting: Family Medicine

## 2016-01-09 DIAGNOSIS — N400 Enlarged prostate without lower urinary tract symptoms: Secondary | ICD-10-CM | POA: Diagnosis not present

## 2016-01-09 DIAGNOSIS — A499 Bacterial infection, unspecified: Secondary | ICD-10-CM | POA: Diagnosis not present

## 2016-01-09 DIAGNOSIS — N390 Urinary tract infection, site not specified: Secondary | ICD-10-CM | POA: Diagnosis not present

## 2016-01-10 DIAGNOSIS — N390 Urinary tract infection, site not specified: Secondary | ICD-10-CM | POA: Diagnosis not present

## 2016-01-10 DIAGNOSIS — A499 Bacterial infection, unspecified: Secondary | ICD-10-CM | POA: Diagnosis not present

## 2016-01-12 ENCOUNTER — Other Ambulatory Visit: Payer: Self-pay | Admitting: Family Medicine

## 2016-01-12 DIAGNOSIS — I10 Essential (primary) hypertension: Secondary | ICD-10-CM

## 2016-01-18 ENCOUNTER — Other Ambulatory Visit: Payer: Medicare Other

## 2016-01-18 ENCOUNTER — Other Ambulatory Visit: Payer: Self-pay | Admitting: Orthopedic Surgery

## 2016-01-18 DIAGNOSIS — M545 Low back pain: Principal | ICD-10-CM

## 2016-01-18 DIAGNOSIS — Z7901 Long term (current) use of anticoagulants: Secondary | ICD-10-CM | POA: Diagnosis not present

## 2016-01-18 DIAGNOSIS — G8929 Other chronic pain: Secondary | ICD-10-CM

## 2016-01-18 DIAGNOSIS — M5137 Other intervertebral disc degeneration, lumbosacral region: Secondary | ICD-10-CM

## 2016-01-19 LAB — PROTIME-INR
INR: 3.6 — AB (ref 0.8–1.2)
PROTHROMBIN TIME: 35.1 s — AB (ref 9.1–12.0)

## 2016-01-25 ENCOUNTER — Ambulatory Visit
Admission: RE | Admit: 2016-01-25 | Discharge: 2016-01-25 | Disposition: A | Discharge status: Home / Self Care | Payer: Medicare Other | Source: Ambulatory Visit | Attending: Orthopedic Surgery | Admitting: Orthopedic Surgery

## 2016-01-25 DIAGNOSIS — M47896 Other spondylosis, lumbar region: Secondary | ICD-10-CM | POA: Insufficient documentation

## 2016-01-25 DIAGNOSIS — M5136 Other intervertebral disc degeneration, lumbar region: Secondary | ICD-10-CM | POA: Insufficient documentation

## 2016-01-25 DIAGNOSIS — M545 Low back pain, unspecified: Secondary | ICD-10-CM

## 2016-01-25 DIAGNOSIS — M2580 Other specified joint disorders, unspecified joint: Secondary | ICD-10-CM | POA: Diagnosis not present

## 2016-01-25 DIAGNOSIS — G8929 Other chronic pain: Secondary | ICD-10-CM | POA: Diagnosis not present

## 2016-01-25 DIAGNOSIS — M5137 Other intervertebral disc degeneration, lumbosacral region: Secondary | ICD-10-CM | POA: Diagnosis not present

## 2016-01-25 DIAGNOSIS — N200 Calculus of kidney: Secondary | ICD-10-CM | POA: Diagnosis not present

## 2016-01-25 DIAGNOSIS — M5126 Other intervertebral disc displacement, lumbar region: Secondary | ICD-10-CM | POA: Diagnosis not present

## 2016-02-27 ENCOUNTER — Other Ambulatory Visit: Payer: Medicare Other

## 2016-02-27 DIAGNOSIS — Z7901 Long term (current) use of anticoagulants: Secondary | ICD-10-CM | POA: Diagnosis not present

## 2016-02-28 LAB — PROTIME-INR
INR: 2.2 — ABNORMAL HIGH (ref 0.8–1.2)
Prothrombin Time: 22.4 s — ABNORMAL HIGH (ref 9.1–12.0)

## 2016-03-08 ENCOUNTER — Other Ambulatory Visit: Payer: Self-pay | Admitting: Family Medicine

## 2016-03-08 DIAGNOSIS — I4891 Unspecified atrial fibrillation: Secondary | ICD-10-CM

## 2016-03-12 ENCOUNTER — Ambulatory Visit (INDEPENDENT_AMBULATORY_CARE_PROVIDER_SITE_OTHER): Payer: Medicare Other | Admitting: Family Medicine

## 2016-03-12 ENCOUNTER — Encounter: Payer: Self-pay | Admitting: Family Medicine

## 2016-03-12 VITALS — BP 128/72 | HR 80 | Ht 70.0 in | Wt 188.0 lb

## 2016-03-12 DIAGNOSIS — R609 Edema, unspecified: Secondary | ICD-10-CM | POA: Diagnosis not present

## 2016-03-12 DIAGNOSIS — I10 Essential (primary) hypertension: Secondary | ICD-10-CM

## 2016-03-12 DIAGNOSIS — G8929 Other chronic pain: Secondary | ICD-10-CM

## 2016-03-12 DIAGNOSIS — I4891 Unspecified atrial fibrillation: Secondary | ICD-10-CM | POA: Diagnosis not present

## 2016-03-12 DIAGNOSIS — I700 Atherosclerosis of aorta: Secondary | ICD-10-CM | POA: Insufficient documentation

## 2016-03-12 DIAGNOSIS — K219 Gastro-esophageal reflux disease without esophagitis: Secondary | ICD-10-CM | POA: Diagnosis not present

## 2016-03-12 DIAGNOSIS — E782 Mixed hyperlipidemia: Secondary | ICD-10-CM | POA: Diagnosis not present

## 2016-03-12 DIAGNOSIS — M545 Low back pain: Secondary | ICD-10-CM | POA: Diagnosis not present

## 2016-03-12 DIAGNOSIS — F17210 Nicotine dependence, cigarettes, uncomplicated: Secondary | ICD-10-CM | POA: Diagnosis not present

## 2016-03-12 MED ORDER — FUROSEMIDE 20 MG PO TABS: 20.0000 mg | ORAL_TABLET | Freq: Every day | ORAL | 1 refills | Status: DC

## 2016-03-12 MED ORDER — DILTIAZEM HCL 60 MG PO TABS: 60.0000 mg | ORAL_TABLET | Freq: Two times a day (BID) | ORAL | 1 refills | Status: DC

## 2016-03-12 MED ORDER — RANITIDINE HCL 300 MG PO TABS: 300.0000 mg | ORAL_TABLET | Freq: Every day | ORAL | 1 refills | Status: DC

## 2016-03-12 MED ORDER — ATORVASTATIN CALCIUM 40 MG PO TABS: 40.0000 mg | ORAL_TABLET | Freq: Every day | ORAL | 1 refills | Status: DC

## 2016-03-12 NOTE — Progress Notes (Signed)
Name: Shane Simmons   MRN: [id number]    DOB: 08-30-1935   Date:03/12/2016       Progress Note  Subjective  Chief Complaint  Chief Complaint  Patient presents with  . Hypertension  . Hyperlipidemia  . Atrial Fibrillation    takes warfarin for control  . Back Pain    takes Baclofen for  . Gastroesophageal Reflux  . Leg Swelling    takes Lasix for edema    Hypertension  This is a recurrent problem. The current episode started in the past 7 days. The problem is unchanged. The problem is uncontrolled. Pertinent negatives include no anxiety, blurred vision, chest pain, headaches, malaise/fatigue, neck pain, orthopnea, palpitations, peripheral edema, PND, shortness of breath or sweats. Risk factors for coronary artery disease include obesity and smoking/tobacco exposure. Past treatments include calcium channel blockers and diuretics. The current treatment provides mild improvement. There are no compliance problems.  There is no history of angina, kidney disease, CAD/MI, CVA, heart failure, left ventricular hypertrophy, PVD, renovascular disease or retinopathy. There is no history of chronic renal disease, coarctation of the aorta, hyperaldosteronism, hypercortisolism or hyperparathyroidism.  Hyperlipidemia  This is a chronic problem. The current episode started more than 1 month ago. The problem is controlled. Recent lipid tests were reviewed and are normal. He has no history of chronic renal disease, diabetes, hypothyroidism, liver disease, obesity or nephrotic syndrome. Factors aggravating his hyperlipidemia include thiazides. Pertinent negatives include no chest pain, focal weakness, myalgias or shortness of breath. Current antihyperlipidemic treatment includes statins. The current treatment provides moderate improvement of lipids. There are no compliance problems.   Atrial Fibrillation  Presents for follow-up visit. Symptoms include hypertension. Symptoms are negative for chest pain,  dizziness, palpitations, shortness of breath and tachycardia. The symptoms have been stable. Past medical history includes atrial fibrillation and hyperlipidemia.  Back Pain  This is a chronic problem. The current episode started more than 1 year ago. The problem occurs intermittently. The problem has been waxing and waning since onset. The pain is present in the lumbar spine. The quality of the pain is described as aching. The pain does not radiate. Associated symptoms include abdominal pain. Pertinent negatives include no chest pain, dysuria, fever, headaches, tingling or weight loss.  Gastroesophageal Reflux  He complains of abdominal pain. He reports no belching, no chest pain, no choking, no coughing, no dysphagia, no early satiety, no globus sensation, no heartburn, no hoarse voice, no nausea, no sore throat, no stridor or no wheezing. This is a chronic problem. The problem has been waxing and waning. The symptoms are aggravated by certain foods. Pertinent negatives include no melena or weight loss. Risk factors include NSAIDs. He has tried a PPI for the symptoms. The treatment provided moderate relief.  Cough  This is a recurrent problem. The current episode started more than 1 month ago (since resumed smoking). The problem has been waxing and waning. Pertinent negatives include no chest pain, chills, ear pain, fever, headaches, heartburn, myalgias, rash, sore throat, shortness of breath, sweats, weight loss or wheezing. There is no history of environmental allergies.    No problem-specific Assessment & Plan notes found for this encounter.   Past Medical History:  Diagnosis Date  . Cancer ([place])   . GERD (gastroesophageal reflux disease)   . Hyperlipidemia   . Hypertension   . Stroke [facility])     Past Surgical History:  Procedure Laterality Date  . APPENDECTOMY    . kidney removed  due to cancer    No family history on file.  Social History   Social History  . Marital status:  Married    Spouse name: N/A  . Number of children: N/A  . Years of education: N/A   Occupational History  . Not on file.   Social History Main Topics  . Smoking status: Former Research scientist (life sciences)  . Smokeless tobacco: Not on file  . Alcohol use No  . Drug use: No  . Sexual activity: No   Other Topics Concern  . Not on file   Social History Narrative  . No narrative on file    No Known Allergies   Review of Systems  Constitutional: Negative for chills, fever, malaise/fatigue and weight loss.  HENT: Negative for ear discharge, ear pain, hoarse voice and sore throat.   Eyes: Negative for blurred vision.  Respiratory: Negative for cough, sputum production, choking, shortness of breath and wheezing.   Cardiovascular: Negative for chest pain, palpitations, orthopnea, leg swelling and PND.  Gastrointestinal: Positive for abdominal pain. Negative for blood in stool, constipation, diarrhea, dysphagia, heartburn, melena and nausea.  Genitourinary: Negative for dysuria, frequency, hematuria and urgency.  Musculoskeletal: Positive for back pain. Negative for joint pain, myalgias and neck pain.  Skin: Negative for rash.  Neurological: Negative for dizziness, tingling, sensory change, focal weakness and headaches.  Endo/Heme/Allergies: Negative for environmental allergies and polydipsia. Does not bruise/bleed easily.  Psychiatric/Behavioral: Negative for depression and suicidal ideas. The patient is not nervous/anxious and does not have insomnia.      Objective  Vitals:   03/12/16 0951  BP: 128/72  Pulse: 80  Weight: 188 lb (85.3 kg)  Height: 5\' 10"  (1.778 m)    Physical Exam  Constitutional: He is oriented to person, place, and time and well-developed, well-nourished, and in no distress.  HENT:  Head: Normocephalic.  Right Ear: External ear normal.  Left Ear: External ear normal.  Nose: Nose normal.  Mouth/Throat: Oropharynx is clear and moist.  Eyes: Conjunctivae and EOM are normal.  Pupils are equal, round, and reactive to light. Right eye exhibits no discharge. Left eye exhibits no discharge. No scleral icterus.  Neck: Normal range of motion. Neck supple. No JVD present. No tracheal deviation present. No thyromegaly present.  Cardiovascular: Normal rate, regular rhythm, normal heart sounds and intact distal pulses.  Exam reveals no gallop and no friction rub.   No murmur heard. Pulmonary/Chest: Breath sounds normal. No respiratory distress. He has no wheezes. He has no rales.  Abdominal: Soft. Bowel sounds are normal. He exhibits no mass. There is no hepatosplenomegaly. There is no tenderness. There is no rebound, no guarding and no CVA tenderness.  Musculoskeletal: Normal range of motion. He exhibits no edema or tenderness.  Lymphadenopathy:    He has no cervical adenopathy.  Neurological: He is alert and oriented to person, place, and time. He has normal sensation, normal strength, normal reflexes and intact cranial nerves. No cranial nerve deficit.  Skin: Skin is warm. No rash noted.  Psychiatric: Mood and affect normal.  Nursing note and vitals reviewed.     Assessment & Plan  Problem List Items Addressed This Visit      Cardiovascular and Mediastinum   A-fib ([place])   Relevant Medications   furosemide (LASIX) 20 MG tablet   atorvastatin (LIPITOR) 40 MG tablet   diltiazem (CARDIZEM) 60 MG tablet   Other Relevant Orders   INR/PT   Hypertension - Primary   Relevant Medications   furosemide (  LASIX) 20 MG tablet   atorvastatin (LIPITOR) 40 MG tablet   diltiazem (CARDIZEM) 60 MG tablet   Other Relevant Orders   Renal Function Panel   Aortic atherosclerosis ([facility])   Relevant Medications   furosemide (LASIX) 20 MG tablet   atorvastatin (LIPITOR) 40 MG tablet   diltiazem (CARDIZEM) 60 MG tablet     Digestive   GERD (gastroesophageal reflux disease)   Relevant Medications   ranitidine (ZANTAC) 300 MG tablet     Other   Hyperlipidemia   Relevant  Medications   furosemide (LASIX) 20 MG tablet   atorvastatin (LIPITOR) 40 MG tablet   diltiazem (CARDIZEM) 60 MG tablet   Other Relevant Orders   Lipid Profile   Chronic back pain   Relevant Medications   tiZANidine (ZANAFLEX) 2 MG tablet   traMADol (ULTRAM) 50 MG tablet   Dependent edema   Relevant Medications   furosemide (LASIX) 20 MG tablet   Cigarette nicotine dependence without complication    Other Visit Diagnoses   None.   I spent 25 minutes with this patient, More than 50% of that time was spent in face to face education, counseling and care coordination.    Dr. [facility]  03/12/16

## 2016-03-13 LAB — RENAL FUNCTION PANEL
Albumin: 3.7 g/dL (ref 3.5–4.8)
BUN/Creatinine Ratio: 8 — ABNORMAL LOW (ref 10–24)
BUN: 10 mg/dL (ref 8–27)
CALCIUM: 9.1 mg/dL (ref 8.6–10.2)
CO2: 26 mmol/L (ref 18–29)
CREATININE: 1.32 mg/dL — AB (ref 0.76–1.27)
Chloride: 105 mmol/L (ref 96–106)
GFR calc Af Amer: 59 mL/min/{1.73_m2} — ABNORMAL LOW (ref >59)
GFR, EST NON AFRICAN AMERICAN: 51 mL/min/{1.73_m2} — AB (ref >59)
Glucose: 104 mg/dL — ABNORMAL HIGH (ref 65–99)
PHOSPHORUS: 3 mg/dL (ref 2.5–4.5)
Potassium: 4.3 mmol/L (ref 3.5–5.2)
Sodium: 145 mmol/L — ABNORMAL HIGH (ref 134–144)

## 2016-03-13 LAB — PROTIME-INR
INR: 1.6 — ABNORMAL HIGH (ref 0.8–1.2)
PROTHROMBIN TIME: 16.9 s — AB (ref 9.1–12.0)

## 2016-03-13 LAB — LIPID PANEL
CHOL/HDL RATIO: 3.2 ratio (ref 0.0–5.0)
CHOLESTEROL TOTAL: 101 mg/dL (ref 100–199)
HDL: 32 mg/dL — AB (ref >39)
LDL CALC: 36 mg/dL (ref 0–99)
TRIGLYCERIDES: 163 mg/dL — AB (ref 0–149)
VLDL Cholesterol Cal: 33 mg/dL (ref 5–40)

## 2016-03-14 DIAGNOSIS — F3341 Major depressive disorder, recurrent, in partial remission: Secondary | ICD-10-CM | POA: Diagnosis not present

## 2016-03-14 DIAGNOSIS — F09 Unspecified mental disorder due to known physiological condition: Secondary | ICD-10-CM | POA: Diagnosis not present

## 2016-03-25 ENCOUNTER — Other Ambulatory Visit: Payer: Self-pay | Admitting: Family Medicine

## 2016-03-25 DIAGNOSIS — I4891 Unspecified atrial fibrillation: Secondary | ICD-10-CM

## 2016-04-01 ENCOUNTER — Other Ambulatory Visit: Payer: Self-pay

## 2016-04-01 MED ORDER — WARFARIN SODIUM 2 MG PO TABS: 2.0000 mg | ORAL_TABLET | Freq: Every day | ORAL | 1 refills | Status: DC

## 2016-04-03 ENCOUNTER — Encounter: Payer: Self-pay | Admitting: Family Medicine

## 2016-04-03 ENCOUNTER — Other Ambulatory Visit: Payer: Medicare Other

## 2016-04-03 ENCOUNTER — Ambulatory Visit (INDEPENDENT_AMBULATORY_CARE_PROVIDER_SITE_OTHER): Payer: Medicare Other | Admitting: Family Medicine

## 2016-04-03 VITALS — BP 110/70 | HR 70 | Ht 70.0 in | Wt 188.0 lb

## 2016-04-03 DIAGNOSIS — Z7901 Long term (current) use of anticoagulants: Secondary | ICD-10-CM

## 2016-04-03 DIAGNOSIS — Z23 Encounter for immunization: Secondary | ICD-10-CM

## 2016-04-03 DIAGNOSIS — R69 Illness, unspecified: Secondary | ICD-10-CM

## 2016-04-03 DIAGNOSIS — N3001 Acute cystitis with hematuria: Secondary | ICD-10-CM | POA: Diagnosis not present

## 2016-04-03 DIAGNOSIS — N400 Enlarged prostate without lower urinary tract symptoms: Secondary | ICD-10-CM | POA: Diagnosis not present

## 2016-04-03 LAB — POCT URINALYSIS DIPSTICK
Bilirubin, UA: NEGATIVE
Glucose, UA: NEGATIVE
KETONES UA: NEGATIVE
Nitrite, UA: POSITIVE
PROTEIN UA: NEGATIVE
SPEC GRAV UA: 1.02
UROBILINOGEN UA: 0.2
pH, UA: 6

## 2016-04-03 MED ORDER — CEFDINIR 300 MG PO CAPS: 300.0000 mg | ORAL_CAPSULE | Freq: Two times a day (BID) | ORAL | 0 refills | Status: DC

## 2016-04-03 NOTE — Progress Notes (Signed)
Name: Shane Simmons   MRN: [id number]    DOB: 1936/02/05   Date:04/03/2016       Progress Note  Subjective  Chief Complaint  Chief Complaint  Patient presents with  . Urinary Tract Infection    Urinary Frequency   This is a recurrent problem. The current episode started 1 to 4 weeks ago. The problem occurs intermittently. The problem has been gradually worsening. There has been no fever. Associated symptoms include frequency, hematuria and urgency. Pertinent negatives include no chills, discharge, hesitancy, nausea or sweats. He has tried nothing for the symptoms. The treatment provided mild relief. His past medical history is significant for recurrent UTIs.    No problem-specific Assessment & Plan notes found for this encounter.   Past Medical History:  Diagnosis Date  . Cancer ([place])   . GERD (gastroesophageal reflux disease)   . Hyperlipidemia   . Hypertension   . Stroke [facility])     Past Surgical History:  Procedure Laterality Date  . APPENDECTOMY    . kidney removed     due to cancer    History reviewed. No pertinent family history.  Social History   Social History  . Marital status: Married    Spouse name: N/A  . Number of children: N/A  . Years of education: N/A   Occupational History  . Not on file.   Social History Main Topics  . Smoking status: Former Research scientist (life sciences)  . Smokeless tobacco: Not on file  . Alcohol use No  . Drug use: No  . Sexual activity: No   Other Topics Concern  . Not on file   Social History Narrative  . No narrative on file    No Known Allergies   Review of Systems  Constitutional: Negative for chills, fever, malaise/fatigue and weight loss.  HENT: Negative for ear discharge, ear pain and sore throat.   Eyes: Negative for blurred vision.  Respiratory: Negative for cough, sputum production, shortness of breath and wheezing.   Cardiovascular: Negative for chest pain, palpitations and leg swelling.  Gastrointestinal: Negative for  abdominal pain, blood in stool, constipation, diarrhea, heartburn, melena and nausea.  Genitourinary: Positive for frequency, hematuria and urgency. Negative for dysuria and hesitancy.  Musculoskeletal: Negative for back pain, joint pain, myalgias and neck pain.  Skin: Negative for rash.  Neurological: Negative for dizziness, tingling, sensory change, focal weakness and headaches.  Endo/Heme/Allergies: Negative for environmental allergies and polydipsia. Does not bruise/bleed easily.  Psychiatric/Behavioral: Negative for depression and suicidal ideas. The patient is not nervous/anxious and does not have insomnia.      Objective  Vitals:   04/03/16 1031  BP: 110/70  Pulse: 70  Weight: 188 lb (85.3 kg)  Height: 5\' 10"  (1.778 m)    Physical Exam  Constitutional: He is oriented to person, place, and time and well-developed, well-nourished, and in no distress.  HENT:  Head: Normocephalic.  Right Ear: External ear normal.  Left Ear: External ear normal.  Nose: Nose normal.  Mouth/Throat: Oropharynx is clear and moist.  Eyes: Conjunctivae and EOM are normal. Pupils are equal, round, and reactive to light. Right eye exhibits no discharge. Left eye exhibits no discharge. No scleral icterus.  Neck: Normal range of motion. Neck supple. No JVD present. No tracheal deviation present. No thyromegaly present.  Cardiovascular: Normal rate, regular rhythm, normal heart sounds and intact distal pulses.  Exam reveals no gallop and no friction rub.   No murmur heard. Pulmonary/Chest: Breath sounds normal. No respiratory distress.  He has no wheezes. He has no rales.  Abdominal: Soft. Bowel sounds are normal. He exhibits no mass. There is no hepatosplenomegaly. There is no tenderness. There is no rebound, no guarding and no CVA tenderness.  Musculoskeletal: Normal range of motion. He exhibits no edema or tenderness.  Lymphadenopathy:    He has no cervical adenopathy.  Neurological: He is alert and  oriented to person, place, and time. He has normal sensation, normal strength and intact cranial nerves. No cranial nerve deficit.  Skin: Skin is warm. No rash noted.  Psychiatric: Mood and affect normal.  Nursing note and vitals reviewed.     Assessment & Plan  Problem List Items Addressed This Visit      Genitourinary   Benign prostatic hyperplasia without urinary obstruction    Other Visit Diagnoses    Acute cystitis with hematuria    -  Primary   Relevant Medications   cefdinir (OMNICEF) 300 MG capsule   Other Relevant Orders   Urine Culture   POCT urinalysis dipstick (Completed)   Need for Tdap vaccination       Relevant Orders   Tdap vaccine greater than or equal to 7yo IM (Completed)   Taking medication for chronic disease       Chronic anticoagulation       Relevant Orders   INR/PT        Dr. Adriane [facility]  04/03/16

## 2016-04-04 LAB — PROTIME-INR
INR: 6.1 — AB (ref 0.8–1.2)
Prothrombin Time: 57 s — ABNORMAL HIGH (ref 9.1–12.0)

## 2016-04-05 LAB — URINE CULTURE

## 2016-04-25 ENCOUNTER — Other Ambulatory Visit: Payer: Medicare Other

## 2016-04-25 DIAGNOSIS — Z7901 Long term (current) use of anticoagulants: Secondary | ICD-10-CM

## 2016-04-30 DIAGNOSIS — Z7901 Long term (current) use of anticoagulants: Secondary | ICD-10-CM | POA: Diagnosis not present

## 2016-05-01 LAB — PROTIME-INR
INR: 1.7 — AB (ref 0.8–1.2)
PROTHROMBIN TIME: 17.9 s — AB (ref 9.1–12.0)

## 2016-05-25 ENCOUNTER — Encounter: Payer: Self-pay | Admitting: Gynecology

## 2016-05-25 ENCOUNTER — Ambulatory Visit
Admission: EM | Admit: 2016-05-25 | Discharge: 2016-05-25 | Disposition: A | Discharge status: Home / Self Care | Payer: Medicare Other | Attending: Family Medicine | Admitting: Family Medicine

## 2016-05-25 DIAGNOSIS — J40 Bronchitis, not specified as acute or chronic: Secondary | ICD-10-CM

## 2016-05-25 MED ORDER — BENZONATATE 200 MG PO CAPS: 200.0000 mg | ORAL_CAPSULE | Freq: Three times a day (TID) | ORAL | 0 refills | Status: AC | PRN

## 2016-05-25 MED ORDER — AZITHROMYCIN 250 MG PO TABS: 250.0000 mg | ORAL_TABLET | Freq: Every day | ORAL | 0 refills | Status: DC

## 2016-05-25 NOTE — ED Provider Notes (Signed)
CSN: [id number]     Arrival date & time 05/25/16  1415 History   First MD Initiated Contact with Patient 05/25/16 1605     Chief Complaint  Patient presents with  . URI   (Consider location/radiation/quality/duration/timing/severity/associated sxs/prior Treatment) Patient is a 80 year old male, with history of hypertension, hyperlipidemia, COPD, A. fib, and cancer that is currently in remission, presents today for a cough. Patient continues to smoke. Cough is dry. Patient has a history of chronic cough. Patient reports that he has been coughing more than his usual. No fever, no headache, no sore throat, no wheezing and no shortness of breath.  Patient also denies chest pain. Daughter has been given patient a nebulizer treatment at home with no improvement cough noted.   Blood pressure noted to be low today. Daughter reports the patient's blood pressure is always low at home.      Past Medical History:  Diagnosis Date  . Cancer ([place])   . GERD (gastroesophageal reflux disease)   . Hyperlipidemia   . Hypertension   . Stroke [facility])    Past Surgical History:  Procedure Laterality Date  . APPENDECTOMY    . kidney removed     due to cancer   No family history on file. Social History  Substance Use Topics  . Smoking status: Former Research scientist (life sciences)  . Smokeless tobacco: Never Used  . Alcohol use No    Review of Systems  Constitutional: Negative for chills, fatigue and fever.  HENT: Negative for congestion, rhinorrhea, sinus pain, sinus pressure, sneezing and sore throat.   Respiratory: Positive for cough. Negative for shortness of breath and wheezing.   Cardiovascular: Negative for chest pain.  Gastrointestinal: Negative for abdominal pain, nausea and vomiting.  Neurological: Negative for dizziness and headaches.    Allergies  Patient has no known allergies.  Home Medications   Prior to Admission medications   Medication Sig Start Date End Date Taking? Authorizing Provider   acetaminophen (TYLENOL) 325 MG tablet Take 2 tablets by mouth every 4 (four) hours as needed. 02/24/12  Yes Historical Provider, MD  atorvastatin (LIPITOR) 40 MG tablet Take 1 tablet (40 mg total) by mouth daily. 03/12/16  Yes Juline Patch, MD  baclofen (LIORESAL) 10 MG tablet TAKE ONE TABLET BY MOUTH ONCE DAILY 12/29/15  Yes Juline Patch, MD  cefdinir (OMNICEF) 300 MG capsule Take 1 capsule (300 mg total) by mouth 2 (two) times daily. 04/03/16  Yes Juline Patch, MD  diltiazem (CARDIZEM) 60 MG tablet Take 1 tablet (60 mg total) by mouth 2 (two) times daily. 03/12/16  Yes Juline Patch, MD  furosemide (LASIX) 20 MG tablet Take 1 tablet (20 mg total) by mouth daily. 03/12/16  Yes Juline Patch, MD  QUEtiapine (SEROQUEL) 25 MG tablet Take 1 tablet by mouth 2 (two) times daily. 01/24/14  Yes Historical Provider, MD  ranitidine (ZANTAC) 300 MG tablet Take 1 tablet (300 mg total) by mouth daily. 03/12/16  Yes Juline Patch, MD  Senna-Fennel (NATURAL VEGETABLE LAXATIVE PO) Take 1 tablet by mouth daily. 08/30/09  Yes Historical Provider, MD  tamsulosin (FLOMAX) 0.4 MG CAPS capsule Take 1 capsule by mouth daily. 05/05/14  Yes Historical Provider, MD  tiZANidine (ZANAFLEX) 2 MG tablet Take 2 mg by mouth. [facility] 12/25/15  Yes Historical Provider, MD  traMADol Veatrice Bourbon) 50 MG tablet [address] 03/05/16  Yes Historical Provider, MD  warfarin (COUMADIN) 1 MG tablet TAKE ONE TABLET BY MOUTH ONCE DAILY 03/25/16  Yes  Juline Patch, MD  warfarin (COUMADIN) 2 MG tablet Take 1 tablet (2 mg total) by mouth daily. 04/01/16  Yes Juline Patch, MD  azithromycin (ZITHROMAX) 250 MG tablet Take 1 tablet (250 mg total) by mouth daily. Take first 2 tablets together, then 1 every day until finished. 05/25/16   Barry Dienes, NP  benzonatate (TESSALON) 200 MG capsule Take 1 capsule (200 mg total) by mouth 3 (three) times daily as needed for cough. 05/25/16 05/30/16  Barry Dienes, NP   Meds Ordered and Administered this Visit  Medications  - No data to display  BP (!) 95/55   Pulse 85   Temp 98.2 F (36.8 C) (Oral)   Resp 16   Wt 190 lb (86.2 kg)   SpO2 97%   BMI 27.26 kg/m  No data found.   Physical Exam  Constitutional: He appears well-developed and well-nourished.  HENT:  Head: Normocephalic and atraumatic.  Right Ear: External ear normal.  Left Ear: External ear normal.  Nose: Nose normal.  Mouth/Throat: Oropharynx is clear and moist. No oropharyngeal exudate.  Cerumen impaction noted bilaterally.  Eyes: Conjunctivae and EOM are normal. Pupils are equal, round, and reactive to light. Right eye exhibits no discharge.  Neck: Normal range of motion. Neck supple.  Cardiovascular: Normal rate and normal heart sounds.   Positive for irregular rhythm.  Pulmonary/Chest: Effort normal and breath sounds normal. No respiratory distress. He has no wheezes.  Abdominal: Soft. Bowel sounds are normal. He exhibits no distension. There is no tenderness.  Skin: Skin is warm and dry.  Vitals reviewed.   Urgent Care Course   Clinical Course     Procedures (including critical care time)  Labs Review Labs Reviewed - No data to display  Imaging Review No results found.  MDM   1. Bronchitis    Differential includes acute bronchitis or COPD exacerbation. Blood pressure is low today, however daughter reports that patient's blood pressure is always low.  Considering his comorbidities, will send patient home with Z-Pak and Tessalon Perles for coughing. Daughter educated to monitor the blood pressure at home. Daughter informed that patient would need to see the PCP next week for reevaluation.  Discharge instruction given. All questions answered.    Barry Dienes, NP 05/25/16 [phone]

## 2016-05-25 NOTE — ED Triage Notes (Signed)
Per daughter dad with a cough and head congestion x 1 week.

## 2016-06-10 ENCOUNTER — Other Ambulatory Visit: Payer: Self-pay | Admitting: Family Medicine

## 2016-06-10 DIAGNOSIS — K219 Gastro-esophageal reflux disease without esophagitis: Secondary | ICD-10-CM

## 2016-06-10 DIAGNOSIS — I10 Essential (primary) hypertension: Secondary | ICD-10-CM

## 2016-06-19 ENCOUNTER — Other Ambulatory Visit: Payer: Medicare Other

## 2016-06-19 DIAGNOSIS — Z7901 Long term (current) use of anticoagulants: Secondary | ICD-10-CM

## 2016-06-20 LAB — PROTIME-INR
INR: 2.4 — ABNORMAL HIGH (ref 0.8–1.2)
PROTHROMBIN TIME: 24.4 s — AB (ref 9.1–12.0)

## 2016-07-04 DIAGNOSIS — H903 Sensorineural hearing loss, bilateral: Secondary | ICD-10-CM | POA: Diagnosis not present

## 2016-07-04 DIAGNOSIS — H6123 Impacted cerumen, bilateral: Secondary | ICD-10-CM | POA: Diagnosis not present

## 2016-07-11 DIAGNOSIS — H6120 Impacted cerumen, unspecified ear: Secondary | ICD-10-CM | POA: Diagnosis not present

## 2016-07-19 DIAGNOSIS — H6120 Impacted cerumen, unspecified ear: Secondary | ICD-10-CM | POA: Diagnosis not present

## 2016-07-19 DIAGNOSIS — H903 Sensorineural hearing loss, bilateral: Secondary | ICD-10-CM | POA: Diagnosis not present

## 2016-07-22 ENCOUNTER — Other Ambulatory Visit: Payer: Medicare Other

## 2016-07-22 DIAGNOSIS — Z7901 Long term (current) use of anticoagulants: Secondary | ICD-10-CM | POA: Diagnosis not present

## 2016-07-24 LAB — PROTIME-INR
INR: 2.1 — ABNORMAL HIGH (ref 0.8–1.2)
Prothrombin Time: 20.8 s — ABNORMAL HIGH (ref 9.1–12.0)

## 2016-09-03 ENCOUNTER — Encounter: Payer: Self-pay | Admitting: Family Medicine

## 2016-09-03 ENCOUNTER — Ambulatory Visit (INDEPENDENT_AMBULATORY_CARE_PROVIDER_SITE_OTHER): Payer: Medicare Other | Admitting: Family Medicine

## 2016-09-03 VITALS — BP 124/84 | HR 94 | Resp 16 | Ht 70.0 in | Wt 189.0 lb

## 2016-09-03 DIAGNOSIS — K219 Gastro-esophageal reflux disease without esophagitis: Secondary | ICD-10-CM | POA: Diagnosis not present

## 2016-09-03 DIAGNOSIS — N400 Enlarged prostate without lower urinary tract symptoms: Secondary | ICD-10-CM

## 2016-09-03 DIAGNOSIS — M545 Low back pain, unspecified: Secondary | ICD-10-CM

## 2016-09-03 DIAGNOSIS — Z5181 Encounter for therapeutic drug level monitoring: Secondary | ICD-10-CM | POA: Diagnosis not present

## 2016-09-03 DIAGNOSIS — G8929 Other chronic pain: Secondary | ICD-10-CM

## 2016-09-03 DIAGNOSIS — R609 Edema, unspecified: Secondary | ICD-10-CM | POA: Diagnosis not present

## 2016-09-03 DIAGNOSIS — N3000 Acute cystitis without hematuria: Secondary | ICD-10-CM

## 2016-09-03 DIAGNOSIS — I10 Essential (primary) hypertension: Secondary | ICD-10-CM

## 2016-09-03 DIAGNOSIS — Z7901 Long term (current) use of anticoagulants: Secondary | ICD-10-CM

## 2016-09-03 DIAGNOSIS — E782 Mixed hyperlipidemia: Secondary | ICD-10-CM | POA: Diagnosis not present

## 2016-09-03 MED ORDER — RANITIDINE HCL 300 MG PO TABS: 300.0000 mg | ORAL_TABLET | Freq: Every day | ORAL | 1 refills | Status: DC

## 2016-09-03 MED ORDER — BACLOFEN 10 MG PO TABS: 10.0000 mg | ORAL_TABLET | Freq: Every day | ORAL | 1 refills | Status: DC

## 2016-09-03 MED ORDER — FUROSEMIDE 20 MG PO TABS: 20.0000 mg | ORAL_TABLET | Freq: Every day | ORAL | 1 refills | Status: DC

## 2016-09-03 MED ORDER — ATORVASTATIN CALCIUM 40 MG PO TABS: 40.0000 mg | ORAL_TABLET | Freq: Every day | ORAL | 1 refills | Status: DC

## 2016-09-03 MED ORDER — SULFAMETHOXAZOLE-TRIMETHOPRIM 800-160 MG PO TABS: 1.0000 | ORAL_TABLET | Freq: Two times a day (BID) | ORAL | 0 refills | Status: DC

## 2016-09-03 MED ORDER — DILTIAZEM HCL 60 MG PO TABS: 60.0000 mg | ORAL_TABLET | Freq: Two times a day (BID) | ORAL | 1 refills | Status: DC

## 2016-09-03 NOTE — Progress Notes (Signed)
Name: Shane Simmons   MRN: [id number]    DOB: 18-Jan-1936   Date:09/03/2016       Progress Note  Subjective  Chief Complaint  Chief Complaint  Patient presents with  . Hypertension    refill  . Coagulation Disorder  . Urinary Tract Infection    Wants Urine Cx at lab per wife     Hypertension  This is a chronic problem. The current episode started more than 1 year ago. The problem has been waxing and waning since onset. The problem is controlled. Pertinent negatives include no anxiety, blurred vision, chest pain, headaches, malaise/fatigue, neck pain, orthopnea, palpitations, peripheral edema, PND, shortness of breath or sweats. There are no associated agents to hypertension. Risk factors for coronary artery disease include post-menopausal state. Past treatments include ACE inhibitors, calcium channel blockers and diuretics. The current treatment provides moderate improvement. There are no compliance problems.  There is no history of angina, kidney disease, CAD/MI, CVA, heart failure, left ventricular hypertrophy, PVD or retinopathy. There is no history of chronic renal disease, a hypertension causing med or renovascular disease.  Urinary Tract Infection   This is a new problem. The current episode started in the past 7 days. The problem occurs intermittently. The problem has been waxing and waning. The quality of the pain is described as aching. The pain is mild. Pertinent negatives include no chills, discharge, flank pain, frequency, hematuria, hesitancy, nausea, sweats, urgency or vomiting. He has tried nothing for the symptoms. The treatment provided mild relief. There is no history of recurrent UTIs.  Hyperlipidemia  This is a chronic problem. The problem is controlled. Recent lipid tests were reviewed and are normal. He has no history of chronic renal disease. Pertinent negatives include no chest pain, focal weakness, myalgias or shortness of breath. Current antihyperlipidemic treatment  includes statins. There are no known risk factors for coronary artery disease.  Back Pain  This is a chronic problem. The current episode started more than 1 year ago. The problem occurs intermittently. The problem has been waxing and waning since onset. The pain is mild. Pertinent negatives include no abdominal pain, bladder incontinence, bowel incontinence, chest pain, dysuria, fever, headaches, numbness, tingling or weight loss. The treatment provided mild relief.  Gastroesophageal Reflux  He complains of heartburn. He reports no abdominal pain, no belching, no chest pain, no choking, no coughing, no hoarse voice, no nausea, no sore throat or no wheezing. This is a chronic problem. The problem has been waxing and waning. The heartburn is of mild intensity. The symptoms are aggravated by certain foods. Pertinent negatives include no melena or weight loss. He has tried a histamine-2 antagonist for the symptoms. The treatment provided no relief.    No problem-specific Assessment & Plan notes found for this encounter.   Past Medical History:  Diagnosis Date  . Cancer ([place])   . GERD (gastroesophageal reflux disease)   . Hyperlipidemia   . Hypertension   . Stroke [facility])     Past Surgical History:  Procedure Laterality Date  . APPENDECTOMY    . kidney removed     due to cancer    History reviewed. No pertinent family history.  Social History   Social History  . Marital status: Married    Spouse name: N/A  . Number of children: N/A  . Years of education: N/A   Occupational History  . Not on file.   Social History Main Topics  . Smoking status: Former Research scientist (life sciences)  . Smokeless  tobacco: Never Used  . Alcohol use No  . Drug use: No  . Sexual activity: No   Other Topics Concern  . Not on file   Social History Narrative  . No narrative on file    No Known Allergies  Outpatient Medications Prior to Visit  Medication Sig Dispense Refill  . acetaminophen (TYLENOL) 325 MG tablet  Take 2 tablets by mouth every 4 (four) hours as needed.    Marland Kitchen QUEtiapine (SEROQUEL) 25 MG tablet Take 1 tablet by mouth 2 (two) times daily.    . Senna-Fennel (NATURAL VEGETABLE LAXATIVE PO) Take 1 tablet by mouth daily.    . tamsulosin (FLOMAX) 0.4 MG CAPS capsule Take 1 capsule by mouth daily.    Marland Kitchen tiZANidine (ZANAFLEX) 2 MG tablet Take 2 mg by mouth. KC Dr    . traMADol (ULTRAM) 50 MG tablet KC Dr    . warfarin (COUMADIN) 1 MG tablet TAKE ONE TABLET BY MOUTH ONCE DAILY 30 tablet 5  . warfarin (COUMADIN) 2 MG tablet Take 1 tablet (2 mg total) by mouth daily. 60 tablet 1  . atorvastatin (LIPITOR) 40 MG tablet Take 1 tablet (40 mg total) by mouth daily. 90 tablet 1  . azithromycin (ZITHROMAX) 250 MG tablet Take 1 tablet (250 mg total) by mouth daily. Take first 2 tablets together, then 1 every day until finished. 6 tablet 0  . baclofen (LIORESAL) 10 MG tablet TAKE ONE TABLET BY MOUTH ONCE DAILY 30 tablet 1  . cefdinir (OMNICEF) 300 MG capsule Take 1 capsule (300 mg total) by mouth 2 (two) times daily. 20 capsule 0  . diltiazem (CARDIZEM) 60 MG tablet TAKE ONE TABLET BY MOUTH TWICE DAILY 60 tablet 2  . furosemide (LASIX) 20 MG tablet Take 1 tablet (20 mg total) by mouth daily. 90 tablet 1  . ranitidine (ZANTAC) 300 MG tablet TAKE ONE TABLET BY MOUTH ONCE DAILY 30 tablet 3   No facility-administered medications prior to visit.     Review of Systems  Constitutional: Negative for chills, fever, malaise/fatigue and weight loss.  HENT: Negative for ear discharge, ear pain, hoarse voice and sore throat.   Eyes: Negative for blurred vision.  Respiratory: Negative for cough, sputum production, choking, shortness of breath and wheezing.   Cardiovascular: Negative for chest pain, palpitations, orthopnea, leg swelling and PND.  Gastrointestinal: Positive for heartburn. Negative for abdominal pain, blood in stool, bowel incontinence, constipation, diarrhea, melena, nausea and vomiting.  Genitourinary:  Negative for bladder incontinence, dysuria, flank pain, frequency, hematuria, hesitancy and urgency.  Musculoskeletal: Positive for back pain. Negative for joint pain, myalgias and neck pain.  Skin: Negative for rash.  Neurological: Negative for dizziness, tingling, sensory change, focal weakness, numbness and headaches.  Endo/Heme/Allergies: Negative for environmental allergies and polydipsia. Does not bruise/bleed easily.  Psychiatric/Behavioral: Negative for depression and suicidal ideas. The patient is not nervous/anxious and does not have insomnia.      Objective  Vitals:   09/03/16 1435  BP: 124/84  Pulse: 94  Resp: 16  SpO2: 95%  Weight: 189 lb (85.7 kg)  Height: 5\' 10"  (1.778 m)    Physical Exam  Constitutional: He is oriented to person, place, and time and well-developed, well-nourished, and in no distress.  HENT:  Head: Normocephalic.  Right Ear: External ear normal.  Left Ear: External ear normal.  Nose: Nose normal.  Mouth/Throat: Oropharynx is clear and moist.  Eyes: Conjunctivae and EOM are normal. Pupils are equal, round, and reactive to light. Right  eye exhibits no discharge. Left eye exhibits no discharge. No scleral icterus.  Neck: Normal range of motion. Neck supple. No JVD present. No tracheal deviation present. No thyromegaly present.  Cardiovascular: Normal rate, regular rhythm, normal heart sounds and intact distal pulses.  Exam reveals no gallop and no friction rub.   No murmur heard. Pulmonary/Chest: Breath sounds normal. No respiratory distress. He has no wheezes. He has no rales.  Abdominal: Soft. Bowel sounds are normal. He exhibits no mass. There is no hepatosplenomegaly. There is no tenderness. There is no rebound, no guarding and no CVA tenderness.  Musculoskeletal: Normal range of motion. He exhibits no edema or tenderness.  Lymphadenopathy:    He has no cervical adenopathy.  Neurological: He is alert and oriented to person, place, and time. He  has normal sensation, normal strength and intact cranial nerves. No cranial nerve deficit.  Skin: Skin is warm. No rash noted.  Psychiatric: Mood and affect normal.      Assessment & Plan  Problem List Items Addressed This Visit      Cardiovascular and Mediastinum   Hypertension - Primary   Relevant Medications   furosemide (LASIX) 20 MG tablet   diltiazem (CARDIZEM) 60 MG tablet   atorvastatin (LIPITOR) 40 MG tablet   Other Relevant Orders   Renal Function Panel     Digestive   GERD (gastroesophageal reflux disease)   Relevant Medications   ranitidine (ZANTAC) 300 MG tablet     Genitourinary   Benign prostatic hyperplasia without urinary obstruction     Other   Hyperlipidemia   Relevant Medications   furosemide (LASIX) 20 MG tablet   diltiazem (CARDIZEM) 60 MG tablet   atorvastatin (LIPITOR) 40 MG tablet   Other Relevant Orders   Lipid Profile   Chronic back pain   Relevant Medications   baclofen (LIORESAL) 10 MG tablet   Dependent edema   Relevant Medications   furosemide (LASIX) 20 MG tablet    Other Visit Diagnoses    Acute cystitis without hematuria       Relevant Medications   sulfamethoxazole-trimethoprim (BACTRIM DS,SEPTRA DS) 800-160 MG tablet   Anticoagulation management encounter       Relevant Orders   Protime-INR      Meds ordered this encounter  Medications  . furosemide (LASIX) 20 MG tablet    Sig: Take 1 tablet (20 mg total) by mouth daily.    Dispense:  90 tablet    Refill:  1  . diltiazem (CARDIZEM) 60 MG tablet    Sig: Take 1 tablet (60 mg total) by mouth 2 (two) times daily.    Dispense:  180 tablet    Refill:  1    Please consider 90 day supplies to promote better adherence  . ranitidine (ZANTAC) 300 MG tablet    Sig: Take 1 tablet (300 mg total) by mouth daily.    Dispense:  90 tablet    Refill:  1    Please consider 90 day supplies to promote better adherence  . atorvastatin (LIPITOR) 40 MG tablet    Sig: Take 1 tablet (40  mg total) by mouth daily.    Dispense:  90 tablet    Refill:  1  . baclofen (LIORESAL) 10 MG tablet    Sig: Take 1 tablet (10 mg total) by mouth daily.    Dispense:  90 tablet    Refill:  1  . sulfamethoxazole-trimethoprim (BACTRIM DS,SEPTRA DS) 800-160 MG tablet    Sig: Take 1  tablet by mouth 2 (two) times daily.    Dispense:  14 tablet    Refill:  0      Dr. Otilio Miu [facility]  09/03/16

## 2016-09-04 LAB — PROTIME-INR
INR: 2.7 — ABNORMAL HIGH (ref 0.8–1.2)
PROTHROMBIN TIME: 26.6 s — AB (ref 9.1–12.0)

## 2016-09-04 LAB — LIPID PANEL
CHOLESTEROL TOTAL: 125 mg/dL (ref 100–199)
Chol/HDL Ratio: 2.8 ratio (ref 0.0–5.0)
HDL: 44 mg/dL (ref >39)
LDL Calculated: 48 mg/dL (ref 0–99)
TRIGLYCERIDES: 165 mg/dL — AB (ref 0–149)
VLDL Cholesterol Cal: 33 mg/dL (ref 5–40)

## 2016-09-04 LAB — RENAL FUNCTION PANEL
ALBUMIN: 3.8 g/dL (ref 3.5–4.8)
BUN/Creatinine Ratio: 9 — ABNORMAL LOW (ref 10–24)
BUN: 15 mg/dL (ref 8–27)
CALCIUM: 8.9 mg/dL (ref 8.6–10.2)
CHLORIDE: 103 mmol/L (ref 96–106)
CO2: 26 mmol/L (ref 18–29)
Creatinine, Ser: 1.76 mg/dL — ABNORMAL HIGH (ref 0.76–1.27)
GFR calc Af Amer: 42 mL/min/{1.73_m2} — ABNORMAL LOW (ref >59)
GFR calc non Af Amer: 36 mL/min/{1.73_m2} — ABNORMAL LOW (ref >59)
Glucose: 87 mg/dL (ref 65–99)
PHOSPHORUS: 2.3 mg/dL — AB (ref 2.5–4.5)
Potassium: 4 mmol/L (ref 3.5–5.2)
Sodium: 143 mmol/L (ref 134–144)

## 2016-09-05 DIAGNOSIS — F3341 Major depressive disorder, recurrent, in partial remission: Secondary | ICD-10-CM | POA: Diagnosis not present

## 2016-10-04 ENCOUNTER — Other Ambulatory Visit: Payer: Self-pay | Admitting: Family Medicine

## 2016-11-05 ENCOUNTER — Other Ambulatory Visit: Payer: Medicare Other

## 2016-11-05 DIAGNOSIS — Z7901 Long term (current) use of anticoagulants: Secondary | ICD-10-CM | POA: Diagnosis not present

## 2016-11-06 LAB — PROTIME-INR
INR: 2.8 — AB (ref 0.8–1.2)
PROTHROMBIN TIME: 27.4 s — AB (ref 9.1–12.0)

## 2017-01-09 ENCOUNTER — Other Ambulatory Visit: Payer: Self-pay | Admitting: Family Medicine

## 2017-01-17 ENCOUNTER — Encounter: Payer: Self-pay | Admitting: Family Medicine

## 2017-01-17 ENCOUNTER — Ambulatory Visit (INDEPENDENT_AMBULATORY_CARE_PROVIDER_SITE_OTHER): Payer: Medicare Other | Admitting: Family Medicine

## 2017-01-17 VITALS — BP 110/62 | HR 60 | Ht 70.0 in | Wt 197.0 lb

## 2017-01-17 DIAGNOSIS — Z23 Encounter for immunization: Secondary | ICD-10-CM | POA: Diagnosis not present

## 2017-01-17 DIAGNOSIS — E782 Mixed hyperlipidemia: Secondary | ICD-10-CM | POA: Diagnosis not present

## 2017-01-17 DIAGNOSIS — Z7901 Long term (current) use of anticoagulants: Secondary | ICD-10-CM

## 2017-01-17 DIAGNOSIS — K219 Gastro-esophageal reflux disease without esophagitis: Secondary | ICD-10-CM | POA: Diagnosis not present

## 2017-01-17 DIAGNOSIS — R609 Edema, unspecified: Secondary | ICD-10-CM

## 2017-01-17 DIAGNOSIS — F325 Major depressive disorder, single episode, in full remission: Secondary | ICD-10-CM

## 2017-01-17 DIAGNOSIS — I4891 Unspecified atrial fibrillation: Secondary | ICD-10-CM

## 2017-01-17 DIAGNOSIS — I10 Essential (primary) hypertension: Secondary | ICD-10-CM

## 2017-01-17 DIAGNOSIS — F17219 Nicotine dependence, cigarettes, with unspecified nicotine-induced disorders: Secondary | ICD-10-CM | POA: Diagnosis not present

## 2017-01-17 MED ORDER — ATORVASTATIN CALCIUM 40 MG PO TABS: 40.0000 mg | ORAL_TABLET | Freq: Every day | ORAL | 1 refills | Status: DC

## 2017-01-17 MED ORDER — DILTIAZEM HCL 60 MG PO TABS: 60.0000 mg | ORAL_TABLET | Freq: Two times a day (BID) | ORAL | 1 refills | Status: DC

## 2017-01-17 MED ORDER — FUROSEMIDE 20 MG PO TABS: 20.0000 mg | ORAL_TABLET | Freq: Every day | ORAL | 1 refills | Status: DC

## 2017-01-17 MED ORDER — RANITIDINE HCL 300 MG PO TABS: 300.0000 mg | ORAL_TABLET | Freq: Every day | ORAL | 1 refills | Status: DC

## 2017-01-17 NOTE — Progress Notes (Signed)
Name: Shane Simmons   MRN: [id number]    DOB: 03-May-1936   Date:01/17/2017       Progress Note  Subjective  Chief Complaint  Chief Complaint  Patient presents with  . Hypertension  . Hyperlipidemia  . Gastroesophageal Reflux  . Leg Swelling    takes lasix  . Cerebrovascular Accident    takes coumadin     Hypertension  This is a chronic problem. The current episode started more than 1 year ago. The problem is unchanged. The problem is controlled. Pertinent negatives include no anxiety, blurred vision, chest pain, headaches, malaise/fatigue, neck pain, orthopnea, palpitations, peripheral edema, PND, shortness of breath or sweats. There are no associated agents to hypertension. Risk factors for coronary artery disease include dyslipidemia. Past treatments include diuretics and calcium channel blockers. The current treatment provides moderate improvement. There are no compliance problems.  Hypertensive end-organ damage includes CVA. There is no history of angina, kidney disease, CAD/MI, heart failure, left ventricular hypertrophy, PVD or retinopathy. There is no history of chronic renal disease, a hypertension causing med or renovascular disease.  Hyperlipidemia  This is a chronic problem. The problem is controlled. Recent lipid tests were reviewed and are normal. Exacerbating diseases include obesity. He has no history of chronic renal disease or hypothyroidism. Pertinent negatives include no chest pain, focal weakness, myalgias or shortness of breath. Current antihyperlipidemic treatment includes statins. The current treatment provides mild improvement of lipids. There are no compliance problems.  There are no known risk factors for coronary artery disease.  Gastroesophageal Reflux  He reports no abdominal pain, no chest pain, no choking, no coughing, no dysphagia, no early satiety, no heartburn, no nausea, no sore throat or no wheezing. This is a chronic problem. The problem has been  gradually improving. The symptoms are aggravated by certain foods. Pertinent negatives include no melena or weight loss. The treatment provided moderate relief.  Cerebrovascular Accident  The current episode started more than 1 year ago. The problem occurs rarely. The problem has been unchanged. Pertinent negatives include no abdominal pain, anorexia, chest pain, chills, coughing, fever, headaches, joint swelling, myalgias, nausea, neck pain, rash or sore throat. Treatments tried: counmadin.    No problem-specific Assessment & Plan notes found for this encounter.   Past Medical History:  Diagnosis Date  . Cancer ([place])   . GERD (gastroesophageal reflux disease)   . Hyperlipidemia   . Hypertension   . Stroke [facility])     Past Surgical History:  Procedure Laterality Date  . APPENDECTOMY    . kidney removed     due to cancer    No family history on file.  Social History   Social History  . Marital status: Married    Spouse name: N/A  . Number of children: N/A  . Years of education: N/A   Occupational History  . Not on file.   Social History Main Topics  . Smoking status: Former Research scientist (life sciences)  . Smokeless tobacco: Never Used  . Alcohol use No  . Drug use: No  . Sexual activity: No   Other Topics Concern  . Not on file   Social History Narrative  . No narrative on file    No Known Allergies  Outpatient Medications Prior to Visit  Medication Sig Dispense Refill  . acetaminophen (TYLENOL) 325 MG tablet Take 2 tablets by mouth every 4 (four) hours as needed.    . baclofen (LIORESAL) 10 MG tablet Take 1 tablet (10 mg total) by mouth daily.  90 tablet 1  . QUEtiapine (SEROQUEL) 25 MG tablet Take 1 tablet by mouth 2 (two) times daily.    . Senna-Fennel (NATURAL VEGETABLE LAXATIVE PO) Take 1 tablet by mouth daily.    . tamsulosin (FLOMAX) 0.4 MG CAPS capsule Take 1 capsule by mouth daily.    Marland Kitchen tiZANidine (ZANAFLEX) 2 MG tablet Take 2 mg by mouth. KC Dr    . warfarin (COUMADIN) 1  MG tablet TAKE ONE TABLET BY MOUTH ONCE DAILY 30 tablet 5  . warfarin (COUMADIN) 2 MG tablet TAKE 1 TABLET BY MOUTH ONCE DAILY 60 tablet 1  . atorvastatin (LIPITOR) 40 MG tablet Take 1 tablet (40 mg total) by mouth daily. 90 tablet 1  . diltiazem (CARDIZEM) 60 MG tablet Take 1 tablet (60 mg total) by mouth 2 (two) times daily. 180 tablet 1  . furosemide (LASIX) 20 MG tablet Take 1 tablet (20 mg total) by mouth daily. 90 tablet 1  . ranitidine (ZANTAC) 300 MG tablet Take 1 tablet (300 mg total) by mouth daily. 90 tablet 1  . sulfamethoxazole-trimethoprim (BACTRIM DS,SEPTRA DS) 800-160 MG tablet Take 1 tablet by mouth 2 (two) times daily. 14 tablet 0  . traMADol (ULTRAM) 50 MG tablet KC Dr     No facility-administered medications prior to visit.     Review of Systems  Constitutional: Negative for chills, fever, malaise/fatigue and weight loss.  HENT: Negative for ear discharge, ear pain and sore throat.   Eyes: Negative for blurred vision.  Respiratory: Negative for cough, sputum production, choking, shortness of breath and wheezing.   Cardiovascular: Negative for chest pain, palpitations, orthopnea, leg swelling and PND.  Gastrointestinal: Negative for abdominal pain, anorexia, blood in stool, constipation, diarrhea, dysphagia, heartburn, melena and nausea.  Genitourinary: Negative for dysuria, frequency, hematuria and urgency.  Musculoskeletal: Negative for back pain, joint pain, joint swelling, myalgias and neck pain.  Skin: Negative for rash.  Neurological: Negative for dizziness, tingling, sensory change, focal weakness and headaches.  Endo/Heme/Allergies: Negative for environmental allergies and polydipsia. Does not bruise/bleed easily.  Psychiatric/Behavioral: Negative for depression and suicidal ideas. The patient is not nervous/anxious and does not have insomnia.      Objective  Vitals:   01/17/17 1609  BP: 110/62  Pulse: 60  Weight: 197 lb (89.4 kg)  Height: 5\' 10"  (1.778  m)    Physical Exam  Constitutional: He is oriented to person, place, and time and well-developed, well-nourished, and in no distress.  HENT:  Head: Normocephalic.  Right Ear: External ear normal.  Left Ear: External ear normal.  Nose: Nose normal.  Mouth/Throat: Oropharynx is clear and moist.  Eyes: Pupils are equal, round, and reactive to light. Conjunctivae and EOM are normal. Right eye exhibits no discharge. Left eye exhibits no discharge. No scleral icterus.  Neck: Normal range of motion. Neck supple. No JVD present. No tracheal deviation present. No thyromegaly present.  Cardiovascular: Normal rate, regular rhythm, normal heart sounds and intact distal pulses.  Exam reveals no gallop and no friction rub.   No murmur heard. Pulmonary/Chest: Breath sounds normal. No respiratory distress. He has no wheezes. He has no rales.  Abdominal: Soft. Bowel sounds are normal. He exhibits no mass. There is no hepatosplenomegaly. There is no tenderness. There is no rebound, no guarding and no CVA tenderness.  Musculoskeletal: Normal range of motion. He exhibits no edema or tenderness.  Lymphadenopathy:    He has no cervical adenopathy.  Neurological: He is alert and oriented to person, place,  and time. He has normal sensation, normal strength, normal reflexes and intact cranial nerves. No cranial nerve deficit.  Skin: Skin is warm. No rash noted.  Psychiatric: Mood and affect normal.  Nursing note and vitals reviewed.     Assessment & Plan  Problem List Items Addressed This Visit      Cardiovascular and Mediastinum   A-fib ([place])   Relevant Medications   atorvastatin (LIPITOR) 40 MG tablet   diltiazem (CARDIZEM) 60 MG tablet   furosemide (LASIX) 20 MG tablet   Hypertension   Relevant Medications   atorvastatin (LIPITOR) 40 MG tablet   diltiazem (CARDIZEM) 60 MG tablet   furosemide (LASIX) 20 MG tablet   Other Relevant Orders   Renal Function Panel     Digestive   GERD  (gastroesophageal reflux disease)   Relevant Medications   ranitidine (ZANTAC) 300 MG tablet     Other   Depression, major, in remission (HCC)   Hyperlipidemia   Relevant Medications   atorvastatin (LIPITOR) 40 MG tablet   diltiazem (CARDIZEM) 60 MG tablet   furosemide (LASIX) 20 MG tablet   Dependent edema   Relevant Medications   furosemide (LASIX) 20 MG tablet    Other Visit Diagnoses    Chronic anticoagulation    -  Primary   Relevant Orders   INR/PT   Cigarette nicotine dependence with nicotine-induced disorder       Need for vaccination against Streptococcus pneumoniae using pneumococcal conjugate vaccine 13       Influenza vaccine needed       Relevant Orders   Flu Vaccine QUAD 6+ mos PF IM (Fluarix Quad PF) (Completed)      Meds ordered this encounter  Medications  . atorvastatin (LIPITOR) 40 MG tablet    Sig: Take 1 tablet (40 mg total) by mouth daily.    Dispense:  90 tablet    Refill:  1  . diltiazem (CARDIZEM) 60 MG tablet    Sig: Take 1 tablet (60 mg total) by mouth 2 (two) times daily.    Dispense:  180 tablet    Refill:  1    Please consider 90 day supplies to promote better adherence  . ranitidine (ZANTAC) 300 MG tablet    Sig: Take 1 tablet (300 mg total) by mouth daily.    Dispense:  90 tablet    Refill:  1    Please consider 90 day supplies to promote better adherence  . furosemide (LASIX) 20 MG tablet    Sig: Take 1 tablet (20 mg total) by mouth daily.    Dispense:  90 tablet    Refill:  1      Dr. Otilio Miu [facility]  01/17/17

## 2017-01-18 LAB — RENAL FUNCTION PANEL
ALBUMIN: 3.5 g/dL (ref 3.5–4.8)
BUN/Creatinine Ratio: 10 (ref 10–24)
BUN: 14 mg/dL (ref 8–27)
CHLORIDE: 105 mmol/L (ref 96–106)
CO2: 23 mmol/L (ref 20–29)
Calcium: 8.9 mg/dL (ref 8.6–10.2)
Creatinine, Ser: 1.45 mg/dL — ABNORMAL HIGH (ref 0.76–1.27)
GFR calc Af Amer: 53 mL/min/{1.73_m2} — ABNORMAL LOW (ref >59)
GFR calc non Af Amer: 45 mL/min/{1.73_m2} — ABNORMAL LOW (ref >59)
GLUCOSE: 135 mg/dL — AB (ref 65–99)
PHOSPHORUS: 2.3 mg/dL — AB (ref 2.5–4.5)
POTASSIUM: 4 mmol/L (ref 3.5–5.2)
Sodium: 142 mmol/L (ref 134–144)

## 2017-01-18 LAB — PROTIME-INR
INR: 2.5 — ABNORMAL HIGH (ref 0.8–1.2)
Prothrombin Time: 25 s — ABNORMAL HIGH (ref 9.1–12.0)

## 2017-02-24 ENCOUNTER — Other Ambulatory Visit: Payer: Medicare Other

## 2017-02-24 DIAGNOSIS — Z7901 Long term (current) use of anticoagulants: Secondary | ICD-10-CM

## 2017-02-25 LAB — PROTIME-INR
INR: 2.7 — ABNORMAL HIGH (ref 0.8–1.2)
Prothrombin Time: 26.8 s — ABNORMAL HIGH (ref 9.1–12.0)

## 2017-04-03 ENCOUNTER — Other Ambulatory Visit: Payer: Self-pay | Admitting: Family Medicine

## 2017-04-03 DIAGNOSIS — M545 Low back pain, unspecified: Secondary | ICD-10-CM

## 2017-04-03 DIAGNOSIS — G8929 Other chronic pain: Secondary | ICD-10-CM

## 2017-04-07 ENCOUNTER — Other Ambulatory Visit: Payer: Medicare Other

## 2017-04-07 DIAGNOSIS — Z7901 Long term (current) use of anticoagulants: Secondary | ICD-10-CM

## 2017-04-08 ENCOUNTER — Telehealth: Payer: Self-pay | Admitting: Family Medicine

## 2017-04-08 DIAGNOSIS — Z7901 Long term (current) use of anticoagulants: Secondary | ICD-10-CM | POA: Diagnosis not present

## 2017-04-08 NOTE — Telephone Encounter (Signed)
Pt needs refill sent to [facility] in [place]   baclofen (LIORESAL) 10 MG tablet [[id number]]

## 2017-04-09 ENCOUNTER — Other Ambulatory Visit: Payer: Self-pay

## 2017-04-09 DIAGNOSIS — M545 Low back pain: Principal | ICD-10-CM

## 2017-04-09 DIAGNOSIS — G8929 Other chronic pain: Secondary | ICD-10-CM

## 2017-04-09 LAB — PROTIME-INR
INR: 5.9 — AB (ref 0.8–1.2)
PROTHROMBIN TIME: 55.3 s — AB (ref 9.1–12.0)

## 2017-04-09 MED ORDER — BACLOFEN 10 MG PO TABS: 10.0000 mg | ORAL_TABLET | Freq: Every day | ORAL | 0 refills | Status: DC

## 2017-04-22 DIAGNOSIS — F3341 Major depressive disorder, recurrent, in partial remission: Secondary | ICD-10-CM | POA: Diagnosis not present

## 2017-04-22 DIAGNOSIS — F09 Unspecified mental disorder due to known physiological condition: Secondary | ICD-10-CM | POA: Diagnosis not present

## 2017-04-22 DIAGNOSIS — I69919 Unspecified symptoms and signs involving cognitive functions following unspecified cerebrovascular disease: Secondary | ICD-10-CM | POA: Diagnosis not present

## 2017-05-02 ENCOUNTER — Other Ambulatory Visit: Payer: Medicare Other

## 2017-05-02 DIAGNOSIS — Z7901 Long term (current) use of anticoagulants: Secondary | ICD-10-CM | POA: Diagnosis not present

## 2017-05-03 LAB — PROTIME-INR
INR: 3.9 — ABNORMAL HIGH (ref 0.8–1.2)
PROTHROMBIN TIME: 37.4 s — AB (ref 9.1–12.0)

## 2017-05-07 DIAGNOSIS — Z7901 Long term (current) use of anticoagulants: Secondary | ICD-10-CM | POA: Diagnosis not present

## 2017-05-07 DIAGNOSIS — M549 Dorsalgia, unspecified: Secondary | ICD-10-CM | POA: Diagnosis not present

## 2017-05-07 DIAGNOSIS — N390 Urinary tract infection, site not specified: Secondary | ICD-10-CM | POA: Diagnosis present

## 2017-05-07 DIAGNOSIS — Z87891 Personal history of nicotine dependence: Secondary | ICD-10-CM | POA: Diagnosis not present

## 2017-05-07 DIAGNOSIS — K8020 Calculus of gallbladder without cholecystitis without obstruction: Secondary | ICD-10-CM | POA: Diagnosis not present

## 2017-05-07 DIAGNOSIS — R0902 Hypoxemia: Secondary | ICD-10-CM | POA: Diagnosis not present

## 2017-05-07 DIAGNOSIS — R0602 Shortness of breath: Secondary | ICD-10-CM | POA: Diagnosis not present

## 2017-05-07 DIAGNOSIS — B9620 Unspecified Escherichia coli [E. coli] as the cause of diseases classified elsewhere: Secondary | ICD-10-CM | POA: Diagnosis present

## 2017-05-07 DIAGNOSIS — L89151 Pressure ulcer of sacral region, stage 1: Secondary | ICD-10-CM | POA: Diagnosis present

## 2017-05-07 DIAGNOSIS — Z85528 Personal history of other malignant neoplasm of kidney: Secondary | ICD-10-CM | POA: Diagnosis not present

## 2017-05-07 DIAGNOSIS — R319 Hematuria, unspecified: Secondary | ICD-10-CM | POA: Diagnosis present

## 2017-05-07 DIAGNOSIS — N12 Tubulo-interstitial nephritis, not specified as acute or chronic: Secondary | ICD-10-CM | POA: Diagnosis not present

## 2017-05-07 DIAGNOSIS — A419 Sepsis, unspecified organism: Secondary | ICD-10-CM | POA: Diagnosis not present

## 2017-05-07 DIAGNOSIS — G8929 Other chronic pain: Secondary | ICD-10-CM | POA: Diagnosis not present

## 2017-05-07 DIAGNOSIS — R000 Tachycardia, unspecified: Secondary | ICD-10-CM | POA: Diagnosis not present

## 2017-05-07 DIAGNOSIS — K5730 Diverticulosis of large intestine without perforation or abscess without bleeding: Secondary | ICD-10-CM | POA: Diagnosis not present

## 2017-05-07 DIAGNOSIS — R531 Weakness: Secondary | ICD-10-CM | POA: Diagnosis not present

## 2017-05-07 DIAGNOSIS — J449 Chronic obstructive pulmonary disease, unspecified: Secondary | ICD-10-CM | POA: Diagnosis present

## 2017-05-07 DIAGNOSIS — F0390 Unspecified dementia without behavioral disturbance: Secondary | ICD-10-CM | POA: Diagnosis not present

## 2017-05-07 DIAGNOSIS — Z8673 Personal history of transient ischemic attack (TIA), and cerebral infarction without residual deficits: Secondary | ICD-10-CM | POA: Diagnosis not present

## 2017-05-07 DIAGNOSIS — N136 Pyonephrosis: Secondary | ICD-10-CM | POA: Diagnosis not present

## 2017-05-07 DIAGNOSIS — F1011 Alcohol abuse, in remission: Secondary | ICD-10-CM | POA: Diagnosis present

## 2017-05-07 DIAGNOSIS — J811 Chronic pulmonary edema: Secondary | ICD-10-CM | POA: Diagnosis not present

## 2017-05-07 DIAGNOSIS — Z66 Do not resuscitate: Secondary | ICD-10-CM | POA: Diagnosis present

## 2017-05-07 DIAGNOSIS — J90 Pleural effusion, not elsewhere classified: Secondary | ICD-10-CM | POA: Diagnosis not present

## 2017-05-07 DIAGNOSIS — N400 Enlarged prostate without lower urinary tract symptoms: Secondary | ICD-10-CM | POA: Diagnosis present

## 2017-05-07 DIAGNOSIS — E872 Acidosis: Secondary | ICD-10-CM | POA: Diagnosis not present

## 2017-05-07 DIAGNOSIS — Z905 Acquired absence of kidney: Secondary | ICD-10-CM | POA: Diagnosis not present

## 2017-05-07 DIAGNOSIS — N132 Hydronephrosis with renal and ureteral calculous obstruction: Secondary | ICD-10-CM | POA: Diagnosis not present

## 2017-05-07 DIAGNOSIS — F0150 Vascular dementia without behavioral disturbance: Secondary | ICD-10-CM | POA: Diagnosis present

## 2017-05-07 DIAGNOSIS — E785 Hyperlipidemia, unspecified: Secondary | ICD-10-CM | POA: Diagnosis present

## 2017-05-07 DIAGNOSIS — Z79899 Other long term (current) drug therapy: Secondary | ICD-10-CM | POA: Diagnosis not present

## 2017-05-07 DIAGNOSIS — I4891 Unspecified atrial fibrillation: Secondary | ICD-10-CM | POA: Diagnosis not present

## 2017-05-07 DIAGNOSIS — F3340 Major depressive disorder, recurrent, in remission, unspecified: Secondary | ICD-10-CM | POA: Diagnosis not present

## 2017-05-07 DIAGNOSIS — R509 Fever, unspecified: Secondary | ICD-10-CM | POA: Diagnosis not present

## 2017-05-11 MED ORDER — ACETAMINOPHEN 325 MG PO TABS
650.00 mg | ORAL_TABLET | ORAL | Status: DC
Start: ? — End: 2017-05-11

## 2017-05-11 MED ORDER — POLYETHYLENE GLYCOL 3350 17 G PO PACK
17.00 | PACK | ORAL | Status: DC
Start: ? — End: 2017-05-11

## 2017-05-11 MED ORDER — WARFARIN SODIUM 1 MG PO TABS
0.50 mg | ORAL_TABLET | ORAL | Status: DC
Start: 2017-05-12 — End: 2017-05-11

## 2017-05-11 MED ORDER — FINASTERIDE 5 MG PO TABS
5.00 mg | ORAL_TABLET | ORAL | Status: DC
Start: 2017-05-12 — End: 2017-05-11

## 2017-05-11 MED ORDER — DILTIAZEM HCL 30 MG PO TABS
30.00 mg | ORAL_TABLET | ORAL | Status: DC
Start: 2017-05-12 — End: 2017-05-11

## 2017-05-11 MED ORDER — QUETIAPINE FUMARATE 25 MG PO TABS
25.00 mg | ORAL_TABLET | ORAL | Status: DC
Start: 2017-05-11 — End: 2017-05-11

## 2017-05-11 MED ORDER — TAMSULOSIN HCL 0.4 MG PO CAPS
0.40 mg | ORAL_CAPSULE | ORAL | Status: DC
Start: 2017-05-11 — End: 2017-05-11

## 2017-05-11 MED ORDER — GENERIC EXTERNAL MEDICATION
1.00 | Status: DC
Start: 2017-05-11 — End: 2017-05-11

## 2017-05-11 MED ORDER — ATORVASTATIN CALCIUM 40 MG PO TABS
40.00 mg | ORAL_TABLET | ORAL | Status: DC
Start: 2017-05-12 — End: 2017-05-11

## 2017-05-14 ENCOUNTER — Other Ambulatory Visit: Payer: Medicare Other

## 2017-05-14 DIAGNOSIS — Z7901 Long term (current) use of anticoagulants: Secondary | ICD-10-CM | POA: Diagnosis not present

## 2017-05-15 LAB — PROTIME-INR
INR: 1.4 — AB (ref 0.8–1.2)
PROTHROMBIN TIME: 14.6 s — AB (ref 9.1–12.0)

## 2017-05-16 DIAGNOSIS — N136 Pyonephrosis: Secondary | ICD-10-CM | POA: Diagnosis not present

## 2017-05-16 DIAGNOSIS — M4800 Spinal stenosis, site unspecified: Secondary | ICD-10-CM | POA: Diagnosis not present

## 2017-05-16 DIAGNOSIS — I69318 Other symptoms and signs involving cognitive functions following cerebral infarction: Secondary | ICD-10-CM | POA: Diagnosis not present

## 2017-05-16 DIAGNOSIS — H9190 Unspecified hearing loss, unspecified ear: Secondary | ICD-10-CM | POA: Diagnosis not present

## 2017-05-16 DIAGNOSIS — G8929 Other chronic pain: Secondary | ICD-10-CM | POA: Diagnosis not present

## 2017-05-16 DIAGNOSIS — F325 Major depressive disorder, single episode, in full remission: Secondary | ICD-10-CM | POA: Diagnosis not present

## 2017-05-16 DIAGNOSIS — A4151 Sepsis due to Escherichia coli [E. coli]: Secondary | ICD-10-CM | POA: Diagnosis not present

## 2017-05-16 DIAGNOSIS — J449 Chronic obstructive pulmonary disease, unspecified: Secondary | ICD-10-CM | POA: Diagnosis not present

## 2017-05-16 DIAGNOSIS — E785 Hyperlipidemia, unspecified: Secondary | ICD-10-CM | POA: Diagnosis not present

## 2017-05-16 DIAGNOSIS — Z7901 Long term (current) use of anticoagulants: Secondary | ICD-10-CM | POA: Diagnosis not present

## 2017-05-16 DIAGNOSIS — Z905 Acquired absence of kidney: Secondary | ICD-10-CM | POA: Diagnosis not present

## 2017-05-16 DIAGNOSIS — Z66 Do not resuscitate: Secondary | ICD-10-CM | POA: Diagnosis not present

## 2017-05-16 DIAGNOSIS — Z87891 Personal history of nicotine dependence: Secondary | ICD-10-CM | POA: Diagnosis not present

## 2017-05-16 DIAGNOSIS — N401 Enlarged prostate with lower urinary tract symptoms: Secondary | ICD-10-CM | POA: Diagnosis not present

## 2017-05-16 DIAGNOSIS — N138 Other obstructive and reflux uropathy: Secondary | ICD-10-CM | POA: Diagnosis not present

## 2017-05-16 DIAGNOSIS — F0150 Vascular dementia without behavioral disturbance: Secondary | ICD-10-CM | POA: Diagnosis not present

## 2017-05-16 DIAGNOSIS — I69322 Dysarthria following cerebral infarction: Secondary | ICD-10-CM | POA: Diagnosis not present

## 2017-05-16 DIAGNOSIS — Z466 Encounter for fitting and adjustment of urinary device: Secondary | ICD-10-CM | POA: Diagnosis not present

## 2017-05-16 DIAGNOSIS — I482 Chronic atrial fibrillation: Secondary | ICD-10-CM | POA: Diagnosis not present

## 2017-05-16 DIAGNOSIS — I959 Hypotension, unspecified: Secondary | ICD-10-CM | POA: Diagnosis not present

## 2017-05-16 DIAGNOSIS — F1011 Alcohol abuse, in remission: Secondary | ICD-10-CM | POA: Diagnosis not present

## 2017-05-21 DIAGNOSIS — N136 Pyonephrosis: Secondary | ICD-10-CM | POA: Diagnosis not present

## 2017-05-21 DIAGNOSIS — I69318 Other symptoms and signs involving cognitive functions following cerebral infarction: Secondary | ICD-10-CM | POA: Diagnosis not present

## 2017-05-21 DIAGNOSIS — A4151 Sepsis due to Escherichia coli [E. coli]: Secondary | ICD-10-CM | POA: Diagnosis not present

## 2017-05-21 DIAGNOSIS — N401 Enlarged prostate with lower urinary tract symptoms: Secondary | ICD-10-CM | POA: Diagnosis not present

## 2017-05-21 DIAGNOSIS — I959 Hypotension, unspecified: Secondary | ICD-10-CM | POA: Diagnosis not present

## 2017-05-21 DIAGNOSIS — N138 Other obstructive and reflux uropathy: Secondary | ICD-10-CM | POA: Diagnosis not present

## 2017-05-22 DIAGNOSIS — R339 Retention of urine, unspecified: Secondary | ICD-10-CM | POA: Diagnosis not present

## 2017-05-23 DIAGNOSIS — N136 Pyonephrosis: Secondary | ICD-10-CM | POA: Diagnosis not present

## 2017-05-23 DIAGNOSIS — N401 Enlarged prostate with lower urinary tract symptoms: Secondary | ICD-10-CM | POA: Diagnosis not present

## 2017-05-23 DIAGNOSIS — I69318 Other symptoms and signs involving cognitive functions following cerebral infarction: Secondary | ICD-10-CM | POA: Diagnosis not present

## 2017-05-23 DIAGNOSIS — N138 Other obstructive and reflux uropathy: Secondary | ICD-10-CM | POA: Diagnosis not present

## 2017-05-23 DIAGNOSIS — I959 Hypotension, unspecified: Secondary | ICD-10-CM | POA: Diagnosis not present

## 2017-05-23 DIAGNOSIS — A4151 Sepsis due to Escherichia coli [E. coli]: Secondary | ICD-10-CM | POA: Diagnosis not present

## 2017-05-26 DIAGNOSIS — A4151 Sepsis due to Escherichia coli [E. coli]: Secondary | ICD-10-CM | POA: Diagnosis not present

## 2017-05-26 DIAGNOSIS — N136 Pyonephrosis: Secondary | ICD-10-CM | POA: Diagnosis not present

## 2017-05-26 DIAGNOSIS — I959 Hypotension, unspecified: Secondary | ICD-10-CM | POA: Diagnosis not present

## 2017-05-26 DIAGNOSIS — N138 Other obstructive and reflux uropathy: Secondary | ICD-10-CM | POA: Diagnosis not present

## 2017-05-26 DIAGNOSIS — I69318 Other symptoms and signs involving cognitive functions following cerebral infarction: Secondary | ICD-10-CM | POA: Diagnosis not present

## 2017-05-26 DIAGNOSIS — N401 Enlarged prostate with lower urinary tract symptoms: Secondary | ICD-10-CM | POA: Diagnosis not present

## 2017-05-28 DIAGNOSIS — N138 Other obstructive and reflux uropathy: Secondary | ICD-10-CM | POA: Diagnosis not present

## 2017-05-28 DIAGNOSIS — N401 Enlarged prostate with lower urinary tract symptoms: Secondary | ICD-10-CM | POA: Diagnosis not present

## 2017-05-28 DIAGNOSIS — A4151 Sepsis due to Escherichia coli [E. coli]: Secondary | ICD-10-CM | POA: Diagnosis not present

## 2017-05-28 DIAGNOSIS — I959 Hypotension, unspecified: Secondary | ICD-10-CM | POA: Diagnosis not present

## 2017-05-28 DIAGNOSIS — I69318 Other symptoms and signs involving cognitive functions following cerebral infarction: Secondary | ICD-10-CM | POA: Diagnosis not present

## 2017-05-28 DIAGNOSIS — N136 Pyonephrosis: Secondary | ICD-10-CM | POA: Diagnosis not present

## 2017-05-29 DIAGNOSIS — R39198 Other difficulties with micturition: Secondary | ICD-10-CM | POA: Diagnosis not present

## 2017-05-29 DIAGNOSIS — R339 Retention of urine, unspecified: Secondary | ICD-10-CM | POA: Diagnosis not present

## 2017-05-30 DIAGNOSIS — A4151 Sepsis due to Escherichia coli [E. coli]: Secondary | ICD-10-CM | POA: Diagnosis not present

## 2017-05-30 DIAGNOSIS — N138 Other obstructive and reflux uropathy: Secondary | ICD-10-CM | POA: Diagnosis not present

## 2017-05-30 DIAGNOSIS — I959 Hypotension, unspecified: Secondary | ICD-10-CM | POA: Diagnosis not present

## 2017-05-30 DIAGNOSIS — N401 Enlarged prostate with lower urinary tract symptoms: Secondary | ICD-10-CM | POA: Diagnosis not present

## 2017-05-30 DIAGNOSIS — N136 Pyonephrosis: Secondary | ICD-10-CM | POA: Diagnosis not present

## 2017-05-30 DIAGNOSIS — I69318 Other symptoms and signs involving cognitive functions following cerebral infarction: Secondary | ICD-10-CM | POA: Diagnosis not present

## 2017-06-02 ENCOUNTER — Other Ambulatory Visit: Payer: Self-pay | Admitting: Family Medicine

## 2017-06-02 DIAGNOSIS — A4151 Sepsis due to Escherichia coli [E. coli]: Secondary | ICD-10-CM | POA: Diagnosis not present

## 2017-06-02 DIAGNOSIS — N136 Pyonephrosis: Secondary | ICD-10-CM | POA: Diagnosis not present

## 2017-06-02 DIAGNOSIS — N138 Other obstructive and reflux uropathy: Secondary | ICD-10-CM | POA: Diagnosis not present

## 2017-06-02 DIAGNOSIS — I69318 Other symptoms and signs involving cognitive functions following cerebral infarction: Secondary | ICD-10-CM | POA: Diagnosis not present

## 2017-06-02 DIAGNOSIS — N401 Enlarged prostate with lower urinary tract symptoms: Secondary | ICD-10-CM | POA: Diagnosis not present

## 2017-06-02 DIAGNOSIS — I959 Hypotension, unspecified: Secondary | ICD-10-CM | POA: Diagnosis not present

## 2017-06-02 DIAGNOSIS — G8929 Other chronic pain: Secondary | ICD-10-CM

## 2017-06-02 DIAGNOSIS — M545 Low back pain: Principal | ICD-10-CM

## 2017-06-04 DIAGNOSIS — N138 Other obstructive and reflux uropathy: Secondary | ICD-10-CM | POA: Diagnosis not present

## 2017-06-04 DIAGNOSIS — A4151 Sepsis due to Escherichia coli [E. coli]: Secondary | ICD-10-CM | POA: Diagnosis not present

## 2017-06-04 DIAGNOSIS — I69318 Other symptoms and signs involving cognitive functions following cerebral infarction: Secondary | ICD-10-CM | POA: Diagnosis not present

## 2017-06-04 DIAGNOSIS — N401 Enlarged prostate with lower urinary tract symptoms: Secondary | ICD-10-CM | POA: Diagnosis not present

## 2017-06-04 DIAGNOSIS — N136 Pyonephrosis: Secondary | ICD-10-CM | POA: Diagnosis not present

## 2017-06-04 DIAGNOSIS — I959 Hypotension, unspecified: Secondary | ICD-10-CM | POA: Diagnosis not present

## 2017-06-05 DIAGNOSIS — I69318 Other symptoms and signs involving cognitive functions following cerebral infarction: Secondary | ICD-10-CM | POA: Diagnosis not present

## 2017-06-05 DIAGNOSIS — I959 Hypotension, unspecified: Secondary | ICD-10-CM | POA: Diagnosis not present

## 2017-06-05 DIAGNOSIS — A4151 Sepsis due to Escherichia coli [E. coli]: Secondary | ICD-10-CM | POA: Diagnosis not present

## 2017-06-05 DIAGNOSIS — N138 Other obstructive and reflux uropathy: Secondary | ICD-10-CM | POA: Diagnosis not present

## 2017-06-05 DIAGNOSIS — N401 Enlarged prostate with lower urinary tract symptoms: Secondary | ICD-10-CM | POA: Diagnosis not present

## 2017-06-05 DIAGNOSIS — N136 Pyonephrosis: Secondary | ICD-10-CM | POA: Diagnosis not present

## 2017-06-06 DIAGNOSIS — E785 Hyperlipidemia, unspecified: Secondary | ICD-10-CM | POA: Diagnosis not present

## 2017-06-06 DIAGNOSIS — F325 Major depressive disorder, single episode, in full remission: Secondary | ICD-10-CM | POA: Diagnosis not present

## 2017-06-06 DIAGNOSIS — Z813 Family history of other psychoactive substance abuse and dependence: Secondary | ICD-10-CM | POA: Diagnosis not present

## 2017-06-06 DIAGNOSIS — J449 Chronic obstructive pulmonary disease, unspecified: Secondary | ICD-10-CM | POA: Diagnosis not present

## 2017-06-06 DIAGNOSIS — Z85528 Personal history of other malignant neoplasm of kidney: Secondary | ICD-10-CM | POA: Diagnosis not present

## 2017-06-06 DIAGNOSIS — Z825 Family history of asthma and other chronic lower respiratory diseases: Secondary | ICD-10-CM | POA: Diagnosis not present

## 2017-06-06 DIAGNOSIS — Z7901 Long term (current) use of anticoagulants: Secondary | ICD-10-CM | POA: Diagnosis not present

## 2017-06-06 DIAGNOSIS — I4891 Unspecified atrial fibrillation: Secondary | ICD-10-CM | POA: Diagnosis not present

## 2017-06-06 DIAGNOSIS — Z79899 Other long term (current) drug therapy: Secondary | ICD-10-CM | POA: Diagnosis not present

## 2017-06-06 DIAGNOSIS — Z87440 Personal history of urinary (tract) infections: Secondary | ICD-10-CM | POA: Diagnosis not present

## 2017-06-06 DIAGNOSIS — Z905 Acquired absence of kidney: Secondary | ICD-10-CM | POA: Diagnosis not present

## 2017-06-06 DIAGNOSIS — R339 Retention of urine, unspecified: Secondary | ICD-10-CM | POA: Diagnosis not present

## 2017-06-06 DIAGNOSIS — N401 Enlarged prostate with lower urinary tract symptoms: Secondary | ICD-10-CM | POA: Diagnosis not present

## 2017-06-06 DIAGNOSIS — N200 Calculus of kidney: Secondary | ICD-10-CM | POA: Diagnosis not present

## 2017-06-06 DIAGNOSIS — Z811 Family history of alcohol abuse and dependence: Secondary | ICD-10-CM | POA: Diagnosis not present

## 2017-06-06 DIAGNOSIS — Z8673 Personal history of transient ischemic attack (TIA), and cerebral infarction without residual deficits: Secondary | ICD-10-CM | POA: Diagnosis not present

## 2017-06-09 DIAGNOSIS — N138 Other obstructive and reflux uropathy: Secondary | ICD-10-CM | POA: Diagnosis not present

## 2017-06-09 DIAGNOSIS — I69318 Other symptoms and signs involving cognitive functions following cerebral infarction: Secondary | ICD-10-CM | POA: Diagnosis not present

## 2017-06-09 DIAGNOSIS — N401 Enlarged prostate with lower urinary tract symptoms: Secondary | ICD-10-CM | POA: Diagnosis not present

## 2017-06-09 DIAGNOSIS — N136 Pyonephrosis: Secondary | ICD-10-CM | POA: Diagnosis not present

## 2017-06-09 DIAGNOSIS — A4151 Sepsis due to Escherichia coli [E. coli]: Secondary | ICD-10-CM | POA: Diagnosis not present

## 2017-06-09 DIAGNOSIS — I959 Hypotension, unspecified: Secondary | ICD-10-CM | POA: Diagnosis not present

## 2017-06-10 ENCOUNTER — Other Ambulatory Visit: Payer: Medicare Other

## 2017-06-10 DIAGNOSIS — Z7901 Long term (current) use of anticoagulants: Secondary | ICD-10-CM

## 2017-06-11 DIAGNOSIS — N401 Enlarged prostate with lower urinary tract symptoms: Secondary | ICD-10-CM | POA: Diagnosis not present

## 2017-06-11 DIAGNOSIS — A4151 Sepsis due to Escherichia coli [E. coli]: Secondary | ICD-10-CM | POA: Diagnosis not present

## 2017-06-11 DIAGNOSIS — N138 Other obstructive and reflux uropathy: Secondary | ICD-10-CM | POA: Diagnosis not present

## 2017-06-11 DIAGNOSIS — I69318 Other symptoms and signs involving cognitive functions following cerebral infarction: Secondary | ICD-10-CM | POA: Diagnosis not present

## 2017-06-11 DIAGNOSIS — I959 Hypotension, unspecified: Secondary | ICD-10-CM | POA: Diagnosis not present

## 2017-06-11 DIAGNOSIS — N136 Pyonephrosis: Secondary | ICD-10-CM | POA: Diagnosis not present

## 2017-06-11 LAB — PROTIME-INR
INR: 1.8 — ABNORMAL HIGH (ref 0.8–1.2)
PROTHROMBIN TIME: 18.1 s — AB (ref 9.1–12.0)

## 2017-06-12 DIAGNOSIS — N401 Enlarged prostate with lower urinary tract symptoms: Secondary | ICD-10-CM | POA: Diagnosis not present

## 2017-06-12 DIAGNOSIS — N138 Other obstructive and reflux uropathy: Secondary | ICD-10-CM | POA: Diagnosis not present

## 2017-06-12 DIAGNOSIS — N136 Pyonephrosis: Secondary | ICD-10-CM | POA: Diagnosis not present

## 2017-06-12 DIAGNOSIS — I959 Hypotension, unspecified: Secondary | ICD-10-CM | POA: Diagnosis not present

## 2017-06-12 DIAGNOSIS — A4151 Sepsis due to Escherichia coli [E. coli]: Secondary | ICD-10-CM | POA: Diagnosis not present

## 2017-06-12 DIAGNOSIS — I69318 Other symptoms and signs involving cognitive functions following cerebral infarction: Secondary | ICD-10-CM | POA: Diagnosis not present

## 2017-06-18 DIAGNOSIS — N138 Other obstructive and reflux uropathy: Secondary | ICD-10-CM | POA: Diagnosis not present

## 2017-06-18 DIAGNOSIS — A4151 Sepsis due to Escherichia coli [E. coli]: Secondary | ICD-10-CM | POA: Diagnosis not present

## 2017-06-18 DIAGNOSIS — I69318 Other symptoms and signs involving cognitive functions following cerebral infarction: Secondary | ICD-10-CM | POA: Diagnosis not present

## 2017-06-18 DIAGNOSIS — N401 Enlarged prostate with lower urinary tract symptoms: Secondary | ICD-10-CM | POA: Diagnosis not present

## 2017-06-18 DIAGNOSIS — N136 Pyonephrosis: Secondary | ICD-10-CM | POA: Diagnosis not present

## 2017-06-18 DIAGNOSIS — I959 Hypotension, unspecified: Secondary | ICD-10-CM | POA: Diagnosis not present

## 2017-06-20 DIAGNOSIS — N401 Enlarged prostate with lower urinary tract symptoms: Secondary | ICD-10-CM | POA: Diagnosis not present

## 2017-06-20 DIAGNOSIS — I69318 Other symptoms and signs involving cognitive functions following cerebral infarction: Secondary | ICD-10-CM | POA: Diagnosis not present

## 2017-06-20 DIAGNOSIS — I959 Hypotension, unspecified: Secondary | ICD-10-CM | POA: Diagnosis not present

## 2017-06-20 DIAGNOSIS — N138 Other obstructive and reflux uropathy: Secondary | ICD-10-CM | POA: Diagnosis not present

## 2017-06-20 DIAGNOSIS — A4151 Sepsis due to Escherichia coli [E. coli]: Secondary | ICD-10-CM | POA: Diagnosis not present

## 2017-06-20 DIAGNOSIS — N136 Pyonephrosis: Secondary | ICD-10-CM | POA: Diagnosis not present

## 2017-07-02 DIAGNOSIS — H25013 Cortical age-related cataract, bilateral: Secondary | ICD-10-CM | POA: Diagnosis not present

## 2017-07-17 ENCOUNTER — Other Ambulatory Visit: Payer: Medicare Other

## 2017-07-17 ENCOUNTER — Other Ambulatory Visit: Payer: Self-pay

## 2017-07-17 DIAGNOSIS — Z7901 Long term (current) use of anticoagulants: Secondary | ICD-10-CM | POA: Diagnosis not present

## 2017-07-18 LAB — PROTIME-INR
INR: 2.1 — AB (ref 0.8–1.2)
Prothrombin Time: 21 s — ABNORMAL HIGH (ref 9.1–12.0)

## 2017-08-11 ENCOUNTER — Other Ambulatory Visit: Payer: Self-pay | Admitting: Family Medicine

## 2017-08-13 NOTE — Telephone Encounter (Signed)
Needs refill sent to [facility] in [place].   warfarin (COUMADIN) 1 MG tablet [[id number]]

## 2017-08-28 ENCOUNTER — Other Ambulatory Visit: Payer: Medicare Other

## 2017-08-28 DIAGNOSIS — I10 Essential (primary) hypertension: Secondary | ICD-10-CM

## 2017-08-28 DIAGNOSIS — Z7901 Long term (current) use of anticoagulants: Secondary | ICD-10-CM

## 2017-08-28 DIAGNOSIS — E782 Mixed hyperlipidemia: Secondary | ICD-10-CM | POA: Diagnosis not present

## 2017-08-28 DIAGNOSIS — R69 Illness, unspecified: Secondary | ICD-10-CM | POA: Diagnosis not present

## 2017-08-29 LAB — HEPATIC FUNCTION PANEL
ALK PHOS: 112 IU/L (ref 39–117)
ALT: 11 IU/L (ref 0–44)
AST: 12 IU/L (ref 0–40)
Bilirubin Total: 0.7 mg/dL (ref 0.0–1.2)
Bilirubin, Direct: 0.26 mg/dL (ref 0.00–0.40)
TOTAL PROTEIN: 6.9 g/dL (ref 6.0–8.5)

## 2017-08-29 LAB — RENAL FUNCTION PANEL
ALBUMIN: 3.7 g/dL (ref 3.5–4.8)
BUN/Creatinine Ratio: 8 — ABNORMAL LOW (ref 10–24)
BUN: 11 mg/dL (ref 8–27)
CO2: 26 mmol/L (ref 20–29)
CREATININE: 1.35 mg/dL — AB (ref 0.76–1.27)
Calcium: 9 mg/dL (ref 8.6–10.2)
Chloride: 105 mmol/L (ref 96–106)
GFR, EST AFRICAN AMERICAN: 57 mL/min/{1.73_m2} — AB (ref >59)
GFR, EST NON AFRICAN AMERICAN: 50 mL/min/{1.73_m2} — AB (ref >59)
GLUCOSE: 84 mg/dL (ref 65–99)
POTASSIUM: 3.7 mmol/L (ref 3.5–5.2)
Phosphorus: 2.3 mg/dL — ABNORMAL LOW (ref 2.5–4.5)
Sodium: 144 mmol/L (ref 134–144)

## 2017-08-29 LAB — LIPID PANEL WITH LDL/HDL RATIO
Cholesterol, Total: 113 mg/dL (ref 100–199)
HDL: 34 mg/dL — AB (ref >39)
LDL CALC: 55 mg/dL (ref 0–99)
LDL/HDL RATIO: 1.6 ratio (ref 0.0–3.6)
Triglycerides: 120 mg/dL (ref 0–149)
VLDL CHOLESTEROL CAL: 24 mg/dL (ref 5–40)

## 2017-08-29 LAB — PROTIME-INR
INR: 1.7 — ABNORMAL HIGH (ref 0.8–1.2)
PROTHROMBIN TIME: 17.2 s — AB (ref 9.1–12.0)

## 2017-09-04 ENCOUNTER — Ambulatory Visit: Payer: Medicare Other | Admitting: Family Medicine

## 2017-09-08 ENCOUNTER — Other Ambulatory Visit: Payer: Self-pay | Admitting: Family Medicine

## 2017-09-08 DIAGNOSIS — I10 Essential (primary) hypertension: Secondary | ICD-10-CM

## 2017-09-15 ENCOUNTER — Encounter: Payer: Self-pay | Admitting: Family Medicine

## 2017-09-15 ENCOUNTER — Ambulatory Visit (INDEPENDENT_AMBULATORY_CARE_PROVIDER_SITE_OTHER): Payer: Medicare Other | Admitting: Family Medicine

## 2017-09-15 VITALS — BP 120/62 | HR 76 | Ht 70.0 in | Wt 180.0 lb

## 2017-09-15 DIAGNOSIS — I4891 Unspecified atrial fibrillation: Secondary | ICD-10-CM

## 2017-09-15 DIAGNOSIS — N390 Urinary tract infection, site not specified: Secondary | ICD-10-CM | POA: Diagnosis not present

## 2017-09-15 DIAGNOSIS — R609 Edema, unspecified: Secondary | ICD-10-CM

## 2017-09-15 DIAGNOSIS — R3912 Poor urinary stream: Secondary | ICD-10-CM | POA: Diagnosis not present

## 2017-09-15 DIAGNOSIS — E782 Mixed hyperlipidemia: Secondary | ICD-10-CM | POA: Diagnosis not present

## 2017-09-15 DIAGNOSIS — F17200 Nicotine dependence, unspecified, uncomplicated: Secondary | ICD-10-CM

## 2017-09-15 DIAGNOSIS — R319 Hematuria, unspecified: Secondary | ICD-10-CM

## 2017-09-15 DIAGNOSIS — I10 Essential (primary) hypertension: Secondary | ICD-10-CM | POA: Diagnosis not present

## 2017-09-15 DIAGNOSIS — K219 Gastro-esophageal reflux disease without esophagitis: Secondary | ICD-10-CM

## 2017-09-15 DIAGNOSIS — F325 Major depressive disorder, single episode, in full remission: Secondary | ICD-10-CM

## 2017-09-15 DIAGNOSIS — C649 Malignant neoplasm of unspecified kidney, except renal pelvis: Secondary | ICD-10-CM

## 2017-09-15 DIAGNOSIS — N401 Enlarged prostate with lower urinary tract symptoms: Secondary | ICD-10-CM

## 2017-09-15 DIAGNOSIS — I700 Atherosclerosis of aorta: Secondary | ICD-10-CM | POA: Diagnosis not present

## 2017-09-15 LAB — POCT URINALYSIS DIPSTICK
Bilirubin, UA: NEGATIVE
Glucose, UA: NEGATIVE
Ketones, UA: NEGATIVE
NITRITE UA: POSITIVE
PH UA: 6 (ref 5.0–8.0)
Spec Grav, UA: 1.01 (ref 1.010–1.025)
UROBILINOGEN UA: 0.2 U/dL

## 2017-09-15 MED ORDER — ATORVASTATIN CALCIUM 40 MG PO TABS: 40.0000 mg | ORAL_TABLET | Freq: Every day | ORAL | 1 refills | Status: AC

## 2017-09-15 MED ORDER — RANITIDINE HCL 300 MG PO TABS: 300.0000 mg | ORAL_TABLET | Freq: Every day | ORAL | 1 refills | Status: AC

## 2017-09-15 MED ORDER — TAMSULOSIN HCL 0.4 MG PO CAPS
0.4000 mg | ORAL_CAPSULE | Freq: Every day | ORAL | 1 refills | Status: AC
Start: 2017-09-15 — End: ?

## 2017-09-15 MED ORDER — NITROFURANTOIN MONOHYD MACRO 100 MG PO CAPS: 100.0000 mg | ORAL_CAPSULE | Freq: Two times a day (BID) | ORAL | 0 refills | Status: DC

## 2017-09-15 MED ORDER — FUROSEMIDE 20 MG PO TABS: 20.0000 mg | ORAL_TABLET | Freq: Every day | ORAL | 1 refills | Status: AC

## 2017-09-15 MED ORDER — DILTIAZEM HCL 60 MG PO TABS: 60.0000 mg | ORAL_TABLET | Freq: Two times a day (BID) | ORAL | 1 refills | Status: AC

## 2017-09-15 NOTE — Progress Notes (Signed)
Name: Shane Simmons   MRN: [id number]    DOB: 07-Jul-1935   Date:09/15/2017       Progress Note  Subjective  Chief Complaint  Chief Complaint  Patient presents with  . Hyperlipidemia  . Hypertension  . Gastroesophageal Reflux  . Benign Prostatic Hypertrophy  . Urinary Tract Infection    Hyperlipidemia  This is a chronic problem. The current episode started more than 1 year ago. The problem is controlled. Recent lipid tests were reviewed and are normal. He has no history of chronic renal disease, diabetes, hypothyroidism, obesity or nephrotic syndrome. There are no known factors aggravating his hyperlipidemia. Pertinent negatives include no chest pain, focal sensory loss, focal weakness, leg pain, myalgias or shortness of breath. Current antihyperlipidemic treatment includes statins. The current treatment provides mild improvement of lipids. There are no compliance problems.  Risk factors for coronary artery disease include hypertension, dyslipidemia, male sex and post-menopausal.  Hypertension  This is a chronic problem. The current episode started more than 1 year ago. The problem has been waxing and waning since onset. The problem is controlled. Pertinent negatives include no anxiety, blurred vision, chest pain, headaches, malaise/fatigue, neck pain, orthopnea, palpitations, peripheral edema, PND, shortness of breath or sweats. There are no associated agents to hypertension. Past treatments include calcium channel blockers and diuretics. The current treatment provides moderate improvement. There are no compliance problems.  Hypertensive end-organ damage includes CVA. There is no history of angina, kidney disease, CAD/MI, heart failure, left ventricular hypertrophy, PVD or retinopathy. There is no history of chronic renal disease or a hypertension causing med.  Gastroesophageal Reflux  He reports no abdominal pain, no belching, no chest pain, no choking, no coughing, no dysphagia, no early  satiety, no globus sensation, no heartburn, no hoarse voice, no nausea, no sore throat, no stridor or no wheezing. This is a new problem. The current episode started more than 1 year ago. The problem occurs constantly. The problem has been gradually improving. The symptoms are aggravated by certain foods. Pertinent negatives include no anemia, fatigue, melena, muscle weakness, orthopnea or weight loss. Risk factors include smoking/tobacco exposure, lack of exercise and ETOH use. He has tried a PPI for the symptoms. The treatment provided moderate relief.  Benign Prostatic Hypertrophy  This is a new problem. The current episode started more than 1 year ago. The problem has been gradually worsening since onset. Irritative symptoms do not include frequency or urgency. Obstructive symptoms include dribbling, incomplete emptying, an intermittent stream and a weak stream. Pertinent negatives include no chills, dysuria, hematuria or nausea.  Urinary Tract Infection   This is a new problem. The current episode started in the past 7 days. The problem occurs every urination. The problem has been gradually worsening. The patient is experiencing no pain. There has been no fever. Pertinent negatives include no chills, frequency, hematuria, nausea, sweats or urgency. The treatment provided mild relief.    No problem-specific Assessment & Plan notes found for this encounter.   Past Medical History:  Diagnosis Date  . Cancer ([place])   . GERD (gastroesophageal reflux disease)   . Hyperlipidemia   . Hypertension   . Stroke [facility])     Past Surgical History:  Procedure Laterality Date  . APPENDECTOMY    . kidney removed     due to cancer    History reviewed. No pertinent family history.  Social History   Socioeconomic History  . Marital status: Married    Spouse name: Not on file  .  Number of children: Not on file  . Years of education: Not on file  . Highest education level: Not on file  Occupational  History  . Not on file  Social Needs  . Financial resource strain: Not on file  . Food insecurity:    Worry: Not on file    Inability: Not on file  . Transportation needs:    Medical: Not on file    Non-medical: Not on file  Tobacco Use  . Smoking status: Current Every Day Smoker    Packs/day: 1.00    Types: Cigarettes  . Smokeless tobacco: Never Used  . Tobacco comment: went over patches and pills- pt says "patches don't work"  Substance and Sexual Activity  . Alcohol use: Yes    Alcohol/week: 0.0 oz  . Drug use: No  . Sexual activity: Never  Lifestyle  . Physical activity:    Days per week: Not on file    Minutes per session: Not on file  . Stress: Not on file  Relationships  . Social connections:    Talks on phone: Not on file    Gets together: Not on file    Attends religious service: Not on file    Active member of club or organization: Not on file    Attends meetings of clubs or organizations: Not on file    Relationship status: Not on file  . Intimate partner violence:    Fear of current or ex partner: Not on file    Emotionally abused: Not on file    Physically abused: Not on file    Forced sexual activity: Not on file  Other Topics Concern  . Not on file  Social History Narrative  . Not on file    No Known Allergies  Outpatient Medications Prior to Visit  Medication Sig Dispense Refill  . acetaminophen (TYLENOL) 325 MG tablet Take 2 tablets by mouth every 4 (four) hours as needed.    Marland Kitchen QUEtiapine (SEROQUEL) 25 MG tablet Take 1 tablet by mouth 2 (two) times daily.    . Senna-Fennel (NATURAL VEGETABLE LAXATIVE PO) Take 1 tablet by mouth daily.    Marland Kitchen tiZANidine (ZANAFLEX) 2 MG tablet Take 2 mg by mouth. KC Dr    . warfarin (COUMADIN) 1 MG tablet TAKE ONE TABLET BY MOUTH ONCE DAILY 30 tablet 5  . warfarin (COUMADIN) 2 MG tablet TAKE 1 TABLET BY MOUTH ONCE DAILY 60 tablet 1  . atorvastatin (LIPITOR) 40 MG tablet Take 1 tablet (40 mg total) by mouth daily. 90  tablet 1  . diltiazem (CARDIZEM) 60 MG tablet TAKE 1 TABLET BY MOUTH TWICE DAILY 180 tablet 0  . furosemide (LASIX) 20 MG tablet Take 1 tablet (20 mg total) by mouth daily. 90 tablet 1  . ranitidine (ZANTAC) 300 MG tablet Take 1 tablet (300 mg total) by mouth daily. 90 tablet 1  . tamsulosin (FLOMAX) 0.4 MG CAPS capsule Take 1 capsule by mouth daily.    . baclofen (LIORESAL) 10 MG tablet Take 1 tablet (10 mg total) daily by mouth. 30 tablet 0   No facility-administered medications prior to visit.     Review of Systems  Constitutional: Negative for chills, fatigue, fever, malaise/fatigue and weight loss.  HENT: Negative for ear discharge, ear pain, hoarse voice and sore throat.   Eyes: Negative for blurred vision.  Respiratory: Negative for cough, sputum production, choking, shortness of breath and wheezing.   Cardiovascular: Negative for chest pain, palpitations, orthopnea, leg swelling and  PND.  Gastrointestinal: Negative for abdominal pain, blood in stool, constipation, diarrhea, dysphagia, heartburn, melena and nausea.  Genitourinary: Positive for incomplete emptying. Negative for dysuria, frequency, hematuria and urgency.  Musculoskeletal: Negative for back pain, joint pain, myalgias, muscle weakness and neck pain.  Skin: Negative for rash.  Neurological: Negative for dizziness, tingling, sensory change, focal weakness and headaches.  Endo/Heme/Allergies: Negative for environmental allergies and polydipsia. Does not bruise/bleed easily.  Psychiatric/Behavioral: Negative for depression and suicidal ideas. The patient is not nervous/anxious and does not have insomnia.      Objective  Vitals:   09/15/17 1548  BP: 120/62  Pulse: 76  Weight: 180 lb (81.6 kg)  Height: 5\' 10"  (1.778 m)    Physical Exam  Constitutional: He is oriented to person, place, and time.  HENT:  Head: Normocephalic.  Right Ear: External ear normal.  Left Ear: External ear normal.  Nose: Nose normal.   Mouth/Throat: Oropharynx is clear and moist.  Eyes: Pupils are equal, round, and reactive to light. Conjunctivae and EOM are normal. Right eye exhibits no discharge. Left eye exhibits no discharge. No scleral icterus.  Neck: Normal range of motion. Neck supple. No JVD present. No tracheal deviation present. No thyromegaly present.  Cardiovascular: Normal rate, regular rhythm, normal heart sounds and intact distal pulses. Exam reveals no gallop and no friction rub.  No murmur heard. Pulmonary/Chest: Breath sounds normal. No respiratory distress. He has no wheezes. He has no rales.  Abdominal: Soft. Bowel sounds are normal. He exhibits no mass. There is no hepatosplenomegaly. There is no tenderness. There is no rebound, no guarding and no CVA tenderness.  Musculoskeletal: Normal range of motion. He exhibits no edema or tenderness.  Lymphadenopathy:    He has no cervical adenopathy.  Neurological: He is alert and oriented to person, place, and time. He has normal strength and normal reflexes. No cranial nerve deficit.  Skin: Skin is warm. No rash noted.  Nursing note and vitals reviewed.     Assessment & Plan  Problem List Items Addressed This Visit      Cardiovascular and Mediastinum   A-fib (HCC)   Relevant Medications   furosemide (LASIX) 20 MG tablet   diltiazem (CARDIZEM) 60 MG tablet   atorvastatin (LIPITOR) 40 MG tablet   Hypertension - Primary   Relevant Medications   furosemide (LASIX) 20 MG tablet   diltiazem (CARDIZEM) 60 MG tablet   atorvastatin (LIPITOR) 40 MG tablet   Other Relevant Orders   Renal Function Panel   Aortic atherosclerosis (HCC)   Relevant Medications   furosemide (LASIX) 20 MG tablet   diltiazem (CARDIZEM) 60 MG tablet   atorvastatin (LIPITOR) 40 MG tablet     Digestive   GERD (gastroesophageal reflux disease)   Relevant Medications   ranitidine (ZANTAC) 300 MG tablet     Genitourinary   Cancer of kidney (HCC)     Other   Depression, major,  in remission (HCC)   Hyperlipidemia   Relevant Medications   furosemide (LASIX) 20 MG tablet   diltiazem (CARDIZEM) 60 MG tablet   atorvastatin (LIPITOR) 40 MG tablet   Other Relevant Orders   Lipid panel   Dependent edema   Relevant Medications   furosemide (LASIX) 20 MG tablet    Other Visit Diagnoses    Tobacco dependence       Benign prostatic hyperplasia with weak urinary stream       Relevant Medications   tamsulosin (FLOMAX) 0.4 MG CAPS capsule  nitrofurantoin, macrocrystal-monohydrate, (MACROBID) 100 MG capsule   Other Relevant Orders   PSA   Urinary tract infection with hematuria, site unspecified       microscopic hematuria/ cover for prostate/NEW   Relevant Medications   nitrofurantoin, macrocrystal-monohydrate, (MACROBID) 100 MG capsule   Other Relevant Orders   POCT Urinalysis Dipstick (Completed)      Meds ordered this encounter  Medications  . furosemide (LASIX) 20 MG tablet    Sig: Take 1 tablet (20 mg total) by mouth daily.    Dispense:  90 tablet    Refill:  1  . diltiazem (CARDIZEM) 60 MG tablet    Sig: Take 1 tablet (60 mg total) by mouth 2 (two) times daily.    Dispense:  180 tablet    Refill:  1  . ranitidine (ZANTAC) 300 MG tablet    Sig: Take 1 tablet (300 mg total) by mouth daily.    Dispense:  90 tablet    Refill:  1    Please consider 90 day supplies to promote better adherence  . atorvastatin (LIPITOR) 40 MG tablet    Sig: Take 1 tablet (40 mg total) by mouth daily.    Dispense:  90 tablet    Refill:  1  . tamsulosin (FLOMAX) 0.4 MG CAPS capsule    Sig: Take 1 capsule (0.4 mg total) by mouth daily.    Dispense:  90 capsule    Refill:  1  . nitrofurantoin, macrocrystal-monohydrate, (MACROBID) 100 MG capsule    Sig: Take 1 capsule (100 mg total) by mouth 2 (two) times daily.    Dispense:  30 capsule    Refill:  0      Dr. [facility]til[facility]  09/15/17

## 2017-09-16 LAB — LIPID PANEL
CHOLESTEROL TOTAL: 107 mg/dL (ref 100–199)
Chol/HDL Ratio: 2.9 ratio (ref 0.0–5.0)
HDL: 37 mg/dL — ABNORMAL LOW (ref >39)
LDL Calculated: 53 mg/dL (ref 0–99)
Triglycerides: 87 mg/dL (ref 0–149)
VLDL Cholesterol Cal: 17 mg/dL (ref 5–40)

## 2017-09-16 LAB — RENAL FUNCTION PANEL
ALBUMIN: 3.8 g/dL (ref 3.5–4.8)
BUN / CREAT RATIO: 9 — AB (ref 10–24)
BUN: 13 mg/dL (ref 8–27)
CHLORIDE: 104 mmol/L (ref 96–106)
CO2: 26 mmol/L (ref 20–29)
Calcium: 9.5 mg/dL (ref 8.6–10.2)
Creatinine, Ser: 1.44 mg/dL — ABNORMAL HIGH (ref 0.76–1.27)
GFR calc Af Amer: 53 mL/min/{1.73_m2} — ABNORMAL LOW (ref >59)
GFR calc non Af Amer: 46 mL/min/{1.73_m2} — ABNORMAL LOW (ref >59)
Glucose: 126 mg/dL — ABNORMAL HIGH (ref 65–99)
POTASSIUM: 3.8 mmol/L (ref 3.5–5.2)
Phosphorus: 2.7 mg/dL (ref 2.5–4.5)
SODIUM: 146 mmol/L — AB (ref 134–144)

## 2017-09-16 LAB — PSA: PROSTATE SPECIFIC AG, SERUM: 2.2 ng/mL (ref 0.0–4.0)

## 2017-09-18 ENCOUNTER — Other Ambulatory Visit: Payer: Self-pay

## 2017-09-23 ENCOUNTER — Ambulatory Visit (INDEPENDENT_AMBULATORY_CARE_PROVIDER_SITE_OTHER): Payer: Medicare Other | Admitting: Family Medicine

## 2017-09-23 ENCOUNTER — Encounter: Payer: Self-pay | Admitting: Family Medicine

## 2017-09-23 VITALS — BP 120/60 | HR 72 | Ht 68.0 in | Wt 177.8 lb

## 2017-09-23 DIAGNOSIS — N451 Epididymitis: Secondary | ICD-10-CM

## 2017-09-23 DIAGNOSIS — Z7901 Long term (current) use of anticoagulants: Secondary | ICD-10-CM

## 2017-09-23 DIAGNOSIS — F17200 Nicotine dependence, unspecified, uncomplicated: Secondary | ICD-10-CM

## 2017-09-23 DIAGNOSIS — M7912 Myalgia of auxiliary muscles, head and neck: Secondary | ICD-10-CM | POA: Diagnosis not present

## 2017-09-23 MED ORDER — DOXYCYCLINE HYCLATE 100 MG PO TABS
100.0000 mg | ORAL_TABLET | Freq: Two times a day (BID) | ORAL | 0 refills | Status: DC
Start: 2017-09-23 — End: 2017-11-10

## 2017-09-23 NOTE — Progress Notes (Signed)
Name: Shane Simmons   MRN: [id number]    DOB: 1936-01-16   Date:09/23/2017       Progress Note  Subjective  Chief Complaint  Chief Complaint  Patient presents with  . Neck Pain    L) side of neck under jaw- "feels swollen" hoarse  . Groin Swelling    L) side is swollen x 1 week and hurting    Neck Pain   This is a new problem. The current episode started today. The problem occurs intermittently. The problem has been unchanged. The pain is associated with nothing. The pain is present in the left side and anterior neck. The quality of the pain is described as aching. The pain is mild. Nothing aggravates the symptoms. Pertinent negatives include no chest pain, fever, headaches, leg pain, numbness, pain with swallowing, paresis, photophobia, syncope, tingling, trouble swallowing, visual change, weakness or weight loss. He has tried nothing for the symptoms. The treatment provided no relief.  Male GU Problem  The patient's primary symptoms include testicular pain. The patient's pertinent negatives include no genital injury, genital itching, genital lesions, pelvic pain, penile discharge, penile pain, priapism or scrotal swelling. This is a new problem. The current episode started 1 to 4 weeks ago (2 weeks). The problem occurs daily. The problem has been waxing and waning. The pain is medium. Pertinent negatives include no abdominal pain, anorexia, chest pain, chills, constipation, coughing, diarrhea, discolored urine, dysuria, fever, flank pain, frequency, headaches, hematuria, hesitancy, joint pain, joint swelling, nausea, painful intercourse, rash, shortness of breath, sore throat, urgency, urinary retention or vomiting. The testicular pain affects the left testicle. The color of the testicles is red. Nothing aggravates the symptoms. He has tried nothing for the symptoms. The treatment provided mild relief. There is no history of BPH or prostatitis.    No problem-specific Assessment & Plan notes  found for this encounter.   Past Medical History:  Diagnosis Date  . Cancer ([place])   . GERD (gastroesophageal reflux disease)   . Hyperlipidemia   . Hypertension   . Stroke [facility])     Past Surgical History:  Procedure Laterality Date  . APPENDECTOMY    . kidney removed     due to cancer    No family history on file.  Social History   Socioeconomic History  . Marital status: Married    Spouse name: Not on file  . Number of children: Not on file  . Years of education: Not on file  . Highest education level: Not on file  Occupational History  . Not on file  Social Needs  . Financial resource strain: Not on file  . Food insecurity:    Worry: Not on file    Inability: Not on file  . Transportation needs:    Medical: Not on file    Non-medical: Not on file  Tobacco Use  . Smoking status: Current Every Day Smoker    Packs/day: 1.00    Types: Cigarettes  . Smokeless tobacco: Never Used  . Tobacco comment: went over patches and pills- pt says "patches don't work"  Substance and Sexual Activity  . Alcohol use: Yes    Alcohol/week: 0.0 oz  . Drug use: No  . Sexual activity: Never  Lifestyle  . Physical activity:    Days per week: Not on file    Minutes per session: Not on file  . Stress: Not on file  Relationships  . Social connections:    Talks on phone: Not  on file    Gets together: Not on file    Attends religious service: Not on file    Active member of club or organization: Not on file    Attends meetings of clubs or organizations: Not on file    Relationship status: Not on file  . Intimate partner violence:    Fear of current or ex partner: Not on file    Emotionally abused: Not on file    Physically abused: Not on file    Forced sexual activity: Not on file  Other Topics Concern  . Not on file  Social History Narrative  . Not on file    No Known Allergies  Outpatient Medications Prior to Visit  Medication Sig Dispense Refill  . acetaminophen  (TYLENOL) 325 MG tablet Take 2 tablets by mouth every 4 (four) hours as needed.    Marland Kitchen atorvastatin (LIPITOR) 40 MG tablet Take 1 tablet (40 mg total) by mouth daily. 90 tablet 1  . diltiazem (CARDIZEM) 60 MG tablet Take 1 tablet (60 mg total) by mouth 2 (two) times daily. 180 tablet 1  . furosemide (LASIX) 20 MG tablet Take 1 tablet (20 mg total) by mouth daily. 90 tablet 1  . nitrofurantoin, macrocrystal-monohydrate, (MACROBID) 100 MG capsule Take 1 capsule (100 mg total) by mouth 2 (two) times daily. 30 capsule 0  . QUEtiapine (SEROQUEL) 25 MG tablet Take 1 tablet by mouth 2 (two) times daily.    . ranitidine (ZANTAC) 300 MG tablet Take 1 tablet (300 mg total) by mouth daily. 90 tablet 1  . Senna-Fennel (NATURAL VEGETABLE LAXATIVE PO) Take 1 tablet by mouth daily.    . tamsulosin (FLOMAX) 0.4 MG CAPS capsule Take 1 capsule (0.4 mg total) by mouth daily. 90 capsule 1  . tiZANidine (ZANAFLEX) 2 MG tablet Take 2 mg by mouth. KC Dr    . warfarin (COUMADIN) 1 MG tablet TAKE ONE TABLET BY MOUTH ONCE DAILY 30 tablet 5  . warfarin (COUMADIN) 2 MG tablet TAKE 1 TABLET BY MOUTH ONCE DAILY 60 tablet 1   No facility-administered medications prior to visit.     Review of Systems  Constitutional: Negative for chills, fever, malaise/fatigue and weight loss.  HENT: Negative for ear discharge, ear pain, sore throat and trouble swallowing.   Eyes: Negative for blurred vision and photophobia.  Respiratory: Negative for cough, sputum production, shortness of breath and wheezing.   Cardiovascular: Negative for chest pain, palpitations, leg swelling and syncope.  Gastrointestinal: Negative for abdominal pain, anorexia, blood in stool, constipation, diarrhea, heartburn, melena, nausea and vomiting.  Genitourinary: Positive for testicular pain. Negative for discharge, dysuria, flank pain, frequency, hematuria, hesitancy, pelvic pain, penile pain, scrotal swelling and urgency.  Musculoskeletal: Positive for neck  pain. Negative for back pain, joint pain and myalgias.  Skin: Negative for rash.  Neurological: Negative for dizziness, tingling, sensory change, focal weakness, weakness, numbness and headaches.  Endo/Heme/Allergies: Negative for environmental allergies and polydipsia. Does not bruise/bleed easily.  Psychiatric/Behavioral: Negative for depression and suicidal ideas. The patient is not nervous/anxious and does not have insomnia.      Objective  Vitals:   09/23/17 1505  BP: 120/60  Pulse: 72  Weight: 177 lb 12.8 oz (80.6 kg)  Height: 5\' 8"  (1.727 m)    Physical Exam  Constitutional: He is oriented to person, place, and time.  HENT:  Head: Normocephalic.  Right Ear: External ear normal.  Left Ear: External ear normal.  Nose: Nose normal.  Mouth/Throat: Oropharynx  is clear and moist.  Eyes: Pupils are equal, round, and reactive to light. Conjunctivae and EOM are normal. Right eye exhibits no discharge. Left eye exhibits no discharge. No scleral icterus.  Neck: Normal range of motion. Neck supple. No JVD present. No tracheal deviation present. No thyromegaly present.  Cardiovascular: Normal rate, regular rhythm, normal heart sounds and intact distal pulses. Exam reveals no gallop and no friction rub.  No murmur heard. Pulmonary/Chest: Breath sounds normal. No respiratory distress. He has no wheezes. He has no rales.  Abdominal: Soft. Bowel sounds are normal. He exhibits no mass. There is no hepatosplenomegaly. There is no tenderness. There is no rebound, no guarding and no CVA tenderness.  Genitourinary: Left testis shows tenderness. Left testis shows no mass and no swelling. Left testis is descended.  Musculoskeletal: Normal range of motion. He exhibits no edema.       Cervical back: He exhibits tenderness.  Insertion left sternocleidomastoid  Lymphadenopathy:    He has no cervical adenopathy.  Neurological: He is alert and oriented to person, place, and time. He has normal  strength and normal reflexes. No cranial nerve deficit.  Skin: Skin is warm. No rash noted.  Nursing note and vitals reviewed.     Assessment & Plan  Problem List Items Addressed This Visit    None    Visit Diagnoses    Epididymitis    -  Primary   Relevant Medications   doxycycline (VIBRA-TABS) 100 MG tablet   Tobacco dependence       Chronic anticoagulation       Relevant Orders   Protime-INR   Sternocleidomastoid muscle tenderness       observe/RTC if continues      Meds ordered this encounter  Medications  . doxycycline (VIBRA-TABS) 100 MG tablet    Sig: Take 1 tablet (100 mg total) by mouth 2 (two) times daily.    Dispense:  20 tablet    Refill:  0      Dr. [facility]  09/23/17

## 2017-09-24 LAB — PROTIME-INR
INR: 2.3 — ABNORMAL HIGH (ref 0.8–1.2)
Prothrombin Time: 22.8 s — ABNORMAL HIGH (ref 9.1–12.0)

## 2017-10-16 ENCOUNTER — Other Ambulatory Visit: Payer: Medicare Other

## 2017-10-16 DIAGNOSIS — Z7901 Long term (current) use of anticoagulants: Secondary | ICD-10-CM

## 2017-10-16 NOTE — Progress Notes (Signed)
labs

## 2017-10-17 DIAGNOSIS — Z7901 Long term (current) use of anticoagulants: Secondary | ICD-10-CM | POA: Diagnosis not present

## 2017-10-18 LAB — PROTIME-INR
INR: 2.2 — AB (ref 0.8–1.2)
Prothrombin Time: 21.8 s — ABNORMAL HIGH (ref 9.1–12.0)

## 2017-11-10 ENCOUNTER — Ambulatory Visit (INDEPENDENT_AMBULATORY_CARE_PROVIDER_SITE_OTHER): Payer: Medicare Other | Admitting: Family Medicine

## 2017-11-10 ENCOUNTER — Encounter: Payer: Self-pay | Admitting: Family Medicine

## 2017-11-10 ENCOUNTER — Ambulatory Visit
Admission: RE | Admit: 2017-11-10 | Discharge: 2017-11-10 | Disposition: A | Discharge status: Home / Self Care | Payer: Medicare Other | Source: Ambulatory Visit | Attending: Family Medicine | Admitting: Family Medicine

## 2017-11-10 VITALS — BP 110/64 | HR 60 | Ht 68.0 in | Wt 170.0 lb

## 2017-11-10 DIAGNOSIS — M50322 Other cervical disc degeneration at C5-C6 level: Secondary | ICD-10-CM | POA: Diagnosis not present

## 2017-11-10 DIAGNOSIS — R1310 Dysphagia, unspecified: Secondary | ICD-10-CM | POA: Insufficient documentation

## 2017-11-10 DIAGNOSIS — H6123 Impacted cerumen, bilateral: Secondary | ICD-10-CM | POA: Diagnosis not present

## 2017-11-10 DIAGNOSIS — F17210 Nicotine dependence, cigarettes, uncomplicated: Secondary | ICD-10-CM | POA: Diagnosis not present

## 2017-11-10 DIAGNOSIS — M542 Cervicalgia: Secondary | ICD-10-CM | POA: Insufficient documentation

## 2017-11-10 DIAGNOSIS — R634 Abnormal weight loss: Secondary | ICD-10-CM

## 2017-11-10 DIAGNOSIS — I4891 Unspecified atrial fibrillation: Secondary | ICD-10-CM

## 2017-11-10 DIAGNOSIS — R1319 Other dysphagia: Secondary | ICD-10-CM | POA: Insufficient documentation

## 2017-11-10 NOTE — Progress Notes (Signed)
Name: Shane Simmons   MRN: [id number]    DOB: 1935/12/11   Date:11/10/2017       Progress Note  Subjective  Chief Complaint  Chief Complaint  Patient presents with  . Neck Pain    L) side of neck, hurts to swallow. Woke him up in the middle of the night hurting. has appt with ENT next weeks for eval. Also, hurting behind ear    Neck Pain   This is a new problem. The current episode started more than 1 month ago. The problem occurs intermittently. The problem has been gradually worsening. The pain is associated with nothing. The pain is present in the left side. The quality of the pain is described as aching. The pain is moderate. The symptoms are aggravated by position. Associated symptoms include weight loss. Pertinent negatives include no chest pain, fever, headaches, leg pain, numbness, pain with swallowing, paresis, photophobia, syncope, tingling, visual change or weakness. He has tried acetaminophen and NSAIDs for the symptoms. The treatment provided moderate relief.  GI Problem  The primary symptoms include weight loss. Primary symptoms do not include fever, fatigue, abdominal pain, nausea, vomiting, diarrhea, melena, hematemesis, jaundice, hematochezia, dysuria, myalgias, arthralgias or rash. The problem has been gradually worsening.  The illness does not include chills, anorexia, dysphagia, odynophagia, bloating, constipation, tenesmus, back pain or itching.    A-fib Will check PT/INR to evaluate anticoagulation status.  Cigarette nicotine dependence without complication Only time he ambulates is for a cigarette.   Past Medical History:  Diagnosis Date  . Cancer ([place])   . GERD (gastroesophageal reflux disease)   . Hyperlipidemia   . Hypertension   . Stroke [facility])     Past Surgical History:  Procedure Laterality Date  . APPENDECTOMY    . kidney removed     due to cancer    History reviewed. No pertinent family history.  Social History   Socioeconomic History   . Marital status: Married    Spouse name: Not on file  . Number of children: Not on file  . Years of education: Not on file  . Highest education level: Not on file  Occupational History  . Not on file  Social Needs  . Financial resource strain: Not on file  . Food insecurity:    Worry: Not on file    Inability: Not on file  . Transportation needs:    Medical: Not on file    Non-medical: Not on file  Tobacco Use  . Smoking status: Current Every Day Smoker    Packs/day: 1.00    Types: Cigarettes  . Smokeless tobacco: Never Used  . Tobacco comment: went over patches and pills- pt says "patches don't work"  Substance and Sexual Activity  . Alcohol use: Yes    Alcohol/week: 0.0 oz  . Drug use: No  . Sexual activity: Never  Lifestyle  . Physical activity:    Days per week: Not on file    Minutes per session: Not on file  . Stress: Not on file  Relationships  . Social connections:    Talks on phone: Not on file    Gets together: Not on file    Attends religious service: Not on file    Active member of club or organization: Not on file    Attends meetings of clubs or organizations: Not on file    Relationship status: Not on file  . Intimate partner violence:    Fear of current or ex partner: Not  on file    Emotionally abused: Not on file    Physically abused: Not on file    Forced sexual activity: Not on file  Other Topics Concern  . Not on file  Social History Narrative  . Not on file    No Known Allergies  Outpatient Medications Prior to Visit  Medication Sig Dispense Refill  . acetaminophen (TYLENOL) 325 MG tablet Take 2 tablets by mouth every 4 (four) hours as needed.    Marland Kitchen atorvastatin (LIPITOR) 40 MG tablet Take 1 tablet (40 mg total) by mouth daily. 90 tablet 1  . diltiazem (CARDIZEM) 60 MG tablet Take 1 tablet (60 mg total) by mouth 2 (two) times daily. 180 tablet 1  . furosemide (LASIX) 20 MG tablet Take 1 tablet (20 mg total) by mouth daily. 90 tablet 1  .  QUEtiapine (SEROQUEL) 25 MG tablet Take 1 tablet by mouth 2 (two) times daily.    . ranitidine (ZANTAC) 300 MG tablet Take 1 tablet (300 mg total) by mouth daily. 90 tablet 1  . Senna-Fennel (NATURAL VEGETABLE LAXATIVE PO) Take 1 tablet by mouth daily.    . tamsulosin (FLOMAX) 0.4 MG CAPS capsule Take 1 capsule (0.4 mg total) by mouth daily. 90 capsule 1  . tiZANidine (ZANAFLEX) 2 MG tablet Take 2 mg by mouth. KC Dr    . warfarin (COUMADIN) 1 MG tablet TAKE ONE TABLET BY MOUTH ONCE DAILY 30 tablet 5  . warfarin (COUMADIN) 2 MG tablet TAKE 1 TABLET BY MOUTH ONCE DAILY 60 tablet 1  . doxycycline (VIBRA-TABS) 100 MG tablet Take 1 tablet (100 mg total) by mouth 2 (two) times daily. 20 tablet 0  . nitrofurantoin, macrocrystal-monohydrate, (MACROBID) 100 MG capsule Take 1 capsule (100 mg total) by mouth 2 (two) times daily. 30 capsule 0   No facility-administered medications prior to visit.     Review of Systems  Constitutional: Positive for weight loss. Negative for chills, fatigue, fever and malaise/fatigue.  HENT: Negative for ear discharge, ear pain and sore throat.   Eyes: Negative for blurred vision and photophobia.  Respiratory: Negative for cough, sputum production, shortness of breath and wheezing.   Cardiovascular: Negative for chest pain, palpitations, leg swelling and syncope.  Gastrointestinal: Negative for abdominal pain, anorexia, bloating, blood in stool, constipation, diarrhea, dysphagia, heartburn, hematemesis, hematochezia, jaundice, melena, nausea and vomiting.       Dysphagia  Genitourinary: Negative for dysuria, frequency, hematuria and urgency.  Musculoskeletal: Positive for neck pain. Negative for arthralgias, back pain, joint pain and myalgias.  Skin: Negative for itching and rash.  Neurological: Negative for dizziness, tingling, sensory change, focal weakness, weakness, numbness and headaches.  Endo/Heme/Allergies: Negative for environmental allergies and polydipsia. Does  not bruise/bleed easily.  Psychiatric/Behavioral: Negative for depression and suicidal ideas. The patient is not nervous/anxious and does not have insomnia.      Objective  Vitals:   11/10/17 1125  BP: 110/64  Pulse: 60  Weight: 170 lb (77.1 kg)  Height: 5\' 8"  (1.727 m)    Physical Exam  Constitutional: He is oriented to person, place, and time. He appears well-developed and well-nourished.  HENT:  Head: Normocephalic.  Right Ear: External ear normal.  Left Ear: External ear normal.  Nose: Nose normal.  Mouth/Throat: Oropharynx is clear and moist.  Eyes: Pupils are equal, round, and reactive to light. Conjunctivae and EOM are normal. Right eye exhibits no discharge. Left eye exhibits no discharge. No scleral icterus.  Neck: Normal range of motion. Neck  supple. No JVD present. No tracheal deviation present. No thyromegaly present.  Cardiovascular: Normal rate, regular rhythm, normal heart sounds and intact distal pulses. Exam reveals no gallop and no friction rub.  No murmur heard. Pulmonary/Chest: Breath sounds normal. No respiratory distress. He has no wheezes. He has no rales.  Abdominal: Soft. Bowel sounds are normal. He exhibits no mass. There is no hepatosplenomegaly. There is no tenderness. There is no rebound, no guarding and no CVA tenderness.  Musculoskeletal: Normal range of motion. He exhibits no edema.       Right shoulder: He exhibits tenderness and spasm. He exhibits normal range of motion, no bony tenderness, no swelling, no effusion, no crepitus, no deformity, no laceration, no pain, normal pulse and normal strength.  Lymphadenopathy:    He has no cervical adenopathy.  Neurological: He is alert and oriented to person, place, and time. He has normal strength and normal reflexes. No cranial nerve deficit.  Skin: Skin is warm. No rash noted.  Nursing note and vitals reviewed.     Assessment & Plan  Problem List Items Addressed This Visit      Cardiovascular  and Mediastinum   A-fib ([place])    Will check PT/INR to evaluate anticoagulation status.      Relevant Orders   Protime-INR     Digestive   Esophageal dysphagia   Relevant Orders   Ambulatory referral to Gastroenterology     Other   Cigarette nicotine dependence without complication    Only time he ambulates is for a cigarette.      Weight loss   Neck pain - Primary   Relevant Orders   DG Cervical Spine Complete (Completed)    Other Visit Diagnoses    Cerumen debris on tympanic membrane of both ears       Patient to see Dr Kathyrn Sheriff for removal.      No orders of the defined types were placed in this encounter.     Dr. Macon [facility]  11/10/17

## 2017-11-10 NOTE — Assessment & Plan Note (Signed)
Only time he ambulates is for a cigarette.

## 2017-11-10 NOTE — Assessment & Plan Note (Signed)
Will check PT/INR to evaluate anticoagulation status.

## 2017-11-11 LAB — PROTIME-INR
INR: 2.2 — ABNORMAL HIGH (ref 0.8–1.2)
PROTHROMBIN TIME: 21.4 s — AB (ref 9.1–12.0)

## 2017-11-17 DIAGNOSIS — R1313 Dysphagia, pharyngeal phase: Secondary | ICD-10-CM | POA: Diagnosis not present

## 2017-11-17 DIAGNOSIS — H6123 Impacted cerumen, bilateral: Secondary | ICD-10-CM | POA: Diagnosis not present

## 2017-11-17 DIAGNOSIS — J3800 Paralysis of vocal cords and larynx, unspecified: Secondary | ICD-10-CM | POA: Diagnosis not present

## 2017-11-20 ENCOUNTER — Other Ambulatory Visit: Payer: Self-pay

## 2017-11-20 ENCOUNTER — Ambulatory Visit
Admission: RE | Admit: 2017-11-20 | Discharge: 2017-11-20 | Disposition: A | Discharge status: Home / Self Care | Payer: Medicare Other | Source: Ambulatory Visit | Attending: Otolaryngology | Admitting: Otolaryngology

## 2017-11-20 ENCOUNTER — Encounter
Admission: RE | Admit: 2017-11-20 | Discharge: 2017-11-20 | Disposition: A | Discharge status: Home / Self Care | Payer: Medicare Other | Source: Ambulatory Visit | Attending: Otolaryngology | Admitting: Otolaryngology

## 2017-11-20 DIAGNOSIS — I4891 Unspecified atrial fibrillation: Secondary | ICD-10-CM | POA: Diagnosis not present

## 2017-11-20 DIAGNOSIS — I10 Essential (primary) hypertension: Secondary | ICD-10-CM | POA: Diagnosis not present

## 2017-11-20 DIAGNOSIS — J90 Pleural effusion, not elsewhere classified: Secondary | ICD-10-CM | POA: Diagnosis not present

## 2017-11-20 DIAGNOSIS — J410 Simple chronic bronchitis: Secondary | ICD-10-CM | POA: Diagnosis not present

## 2017-11-20 DIAGNOSIS — I4510 Unspecified right bundle-branch block: Secondary | ICD-10-CM | POA: Diagnosis not present

## 2017-11-20 DIAGNOSIS — J449 Chronic obstructive pulmonary disease, unspecified: Secondary | ICD-10-CM | POA: Insufficient documentation

## 2017-11-20 DIAGNOSIS — I6340 Cerebral infarction due to embolism of unspecified cerebral artery: Secondary | ICD-10-CM | POA: Diagnosis not present

## 2017-11-20 DIAGNOSIS — I481 Persistent atrial fibrillation: Secondary | ICD-10-CM | POA: Diagnosis not present

## 2017-11-20 HISTORY — DX: Dysphagia, unspecified: R13.10

## 2017-11-20 HISTORY — DX: Unspecified osteoarthritis, unspecified site: M19.90

## 2017-11-20 HISTORY — DX: Cardiac arrhythmia, unspecified: I49.9

## 2017-11-20 HISTORY — DX: Paralysis of vocal cords and larynx, unspecified: J38.00

## 2017-11-20 HISTORY — DX: Personal history of urinary calculi: Z87.442

## 2017-11-20 HISTORY — DX: Chronic obstructive pulmonary disease, unspecified: J44.9

## 2017-11-20 NOTE — Patient Instructions (Signed)
Your procedure is scheduled on: Mon 7/1 Report to Day Surgery. To find out your arrival time please call [phone] between 1PM - 3PM on tomorrow.  Remember: Instructions that are not followed completely may result in serious medical risk,  up to and including death, or upon the discretion of your surgeon and anesthesiologist your  surgery may need to be rescheduled.     _X__ 1. Do not eat food after midnight the night before your procedure.                 No gum chewing or hard candies. You may drink clear liquids up to 2 hours                 before you are scheduled to arrive for your surgery- DO not drink clear                 liquids within 2 hours of the start of your surgery.                 Clear Liquids include:  water, apple juice without pulp, clear carbohydrate                 drink such as Clearfast of Gartorade, Black Coffee or Tea (Do not add                 anything to coffee or tea).  __X__2.  On the morning of surgery brush your teeth with toothpaste and water, you                may rinse your mouth with mouthwash if you wish.  Do not swallow any toothpaste of mouthwash.     _X__ 3.  No Alcohol for 24 hours before or after surgery.   _X__ 4.  Do Not Smoke or use e-cigarettes For 24 Hours Prior to Your Surgery.                 Do not use any chewable tobacco products for at least 6 hours prior to                 surgery.  ____  5.  Bring all medications with you on the day of surgery if instructed.   _x___  6.  Notify your doctor if there is any change in your medical condition      (cold, fever, infections).     Do not wear jewelry, make-up, hairpins, clips or nail polish. Do not wear lotions, powders, or perfumes. You may wear deodorant. Do not shave 48 hours prior to surgery. Men may shave face and neck. Do not bring valuables to the hospital.    [facility] is not responsible for any belongings or valuables.  Contacts, dentures or  bridgework may not be worn into surgery. Leave your suitcase in the car. After surgery it may be brought to your room. For patients admitted to the hospital, discharge time is determined by your treatment team.   Patients discharged the day of surgery will not be allowed to drive home.   Please read over the following fact sheets that you were given:    __x__ Take these medicines the morning of surgery with A SIP OF WATER:    1. acetaminophen (TYLENOL) 325 MG tablet if needed  2. diltiazem (CARDIZEM) 60 MG tablet  3. QUEtiapine (SEROQUEL) 25 MG tablet  4.ranitidine (ZANTAC) 300 MG tablet take extra dose the night before and one the  morning of surgery  5.  6.  ____ Fleet Enema (as directed)   ____ Use CHG Soap as directed  ____ Use inhalers on the day of surgery  ____ Stop metformin 2 days prior to surgery    ____ Take 1/2 of usual insulin dose the night before surgery. No insulin the morning          of surgery.   __x__ Stop Coumadin on last dose on Friday 6/28 __x__ Stop Anti-inflammatories naproxen sodium (ALEVE) 220 MG tablet today   ____ Stop supplements until after surgery.    ____ Bring C-Pap to the hospital.

## 2017-11-20 NOTE — Pre-Procedure Instructions (Signed)
REQUEST /EKG CALLED AND FAXED TO DR Coralie Common. SPOKE WITH DAISY. FAXED FYI TO DR Kathyrn Sheriff

## 2017-11-21 MED ORDER — PHENYLEPHRINE HCL 10 % OP SOLN
Freq: Once | OPHTHALMIC | Status: DC
  Filled 2017-11-21 (×2): qty 10

## 2017-11-23 ENCOUNTER — Encounter: Payer: Self-pay | Admitting: Anesthesiology

## 2017-11-24 ENCOUNTER — Ambulatory Visit
Admission: RE | Admit: 2017-11-24 | Discharge: 2017-11-24 | Disposition: A | Discharge status: Home / Self Care | Payer: Medicare Other | Source: Ambulatory Visit | Attending: Otolaryngology | Admitting: Otolaryngology

## 2017-11-24 ENCOUNTER — Encounter
Admission: RE | Disposition: A | Discharge status: Home / Self Care | Payer: Self-pay | Source: Ambulatory Visit | Attending: Otolaryngology

## 2017-11-24 ENCOUNTER — Encounter: Payer: Self-pay | Admitting: *Deleted

## 2017-11-24 ENCOUNTER — Ambulatory Visit: Payer: Medicare Other | Admitting: Anesthesiology

## 2017-11-24 DIAGNOSIS — J449 Chronic obstructive pulmonary disease, unspecified: Secondary | ICD-10-CM | POA: Insufficient documentation

## 2017-11-24 DIAGNOSIS — K219 Gastro-esophageal reflux disease without esophagitis: Secondary | ICD-10-CM | POA: Diagnosis not present

## 2017-11-24 DIAGNOSIS — M1990 Unspecified osteoarthritis, unspecified site: Secondary | ICD-10-CM | POA: Insufficient documentation

## 2017-11-24 DIAGNOSIS — F17210 Nicotine dependence, cigarettes, uncomplicated: Secondary | ICD-10-CM | POA: Insufficient documentation

## 2017-11-24 DIAGNOSIS — Z8673 Personal history of transient ischemic attack (TIA), and cerebral infarction without residual deficits: Secondary | ICD-10-CM | POA: Insufficient documentation

## 2017-11-24 DIAGNOSIS — I4891 Unspecified atrial fibrillation: Secondary | ICD-10-CM | POA: Diagnosis not present

## 2017-11-24 DIAGNOSIS — Z7901 Long term (current) use of anticoagulants: Secondary | ICD-10-CM | POA: Insufficient documentation

## 2017-11-24 DIAGNOSIS — I739 Peripheral vascular disease, unspecified: Secondary | ICD-10-CM | POA: Insufficient documentation

## 2017-11-24 DIAGNOSIS — J3801 Paralysis of vocal cords and larynx, unilateral: Secondary | ICD-10-CM | POA: Insufficient documentation

## 2017-11-24 DIAGNOSIS — E785 Hyperlipidemia, unspecified: Secondary | ICD-10-CM | POA: Diagnosis not present

## 2017-11-24 DIAGNOSIS — F329 Major depressive disorder, single episode, unspecified: Secondary | ICD-10-CM | POA: Insufficient documentation

## 2017-11-24 DIAGNOSIS — I10 Essential (primary) hypertension: Secondary | ICD-10-CM | POA: Diagnosis not present

## 2017-11-24 DIAGNOSIS — C320 Malignant neoplasm of glottis: Secondary | ICD-10-CM | POA: Diagnosis not present

## 2017-11-24 DIAGNOSIS — C329 Malignant neoplasm of larynx, unspecified: Secondary | ICD-10-CM | POA: Diagnosis not present

## 2017-11-24 HISTORY — PX: DIRECT LARYNGOSCOPY: SHX5326

## 2017-11-24 LAB — POCT I-STAT 4, (NA,K, GLUC, HGB,HCT)
GLUCOSE: 88 mg/dL (ref 70–99)
HCT: 47 % (ref 39.0–52.0)
Hemoglobin: 16 g/dL (ref 13.0–17.0)
POTASSIUM: 3.9 mmol/L (ref 3.5–5.1)
Sodium: 144 mmol/L (ref 135–145)

## 2017-11-24 SURGERY — LARYNGOSCOPY, DIRECT
Anesthesia: General | Laterality: Left | Wound class: Clean Contaminated

## 2017-11-24 MED ORDER — PHENYLEPHRINE HCL 10 MG/ML IJ SOLN
INTRAMUSCULAR | Status: DC | PRN
  Administered 2017-11-24 (×6): 100 ug via INTRAVENOUS

## 2017-11-24 MED ORDER — PROPOFOL 500 MG/50ML IV EMUL
INTRAVENOUS | Status: AC
  Filled 2017-11-24: qty 50

## 2017-11-24 MED ORDER — ONDANSETRON HCL 4 MG/2ML IJ SOLN
INTRAMUSCULAR | Status: AC
  Filled 2017-11-24: qty 2

## 2017-11-24 MED ORDER — DEXAMETHASONE SODIUM PHOSPHATE 10 MG/ML IJ SOLN
INTRAMUSCULAR | Status: DC | PRN
  Administered 2017-11-24: 10 mg via INTRAVENOUS

## 2017-11-24 MED ORDER — SUGAMMADEX SODIUM 200 MG/2ML IV SOLN
INTRAVENOUS | Status: DC | PRN
  Administered 2017-11-24: 200 mg via INTRAVENOUS

## 2017-11-24 MED ORDER — SUCCINYLCHOLINE CHLORIDE 20 MG/ML IJ SOLN
INTRAMUSCULAR | Status: AC
  Filled 2017-11-24: qty 1

## 2017-11-24 MED ORDER — ROCURONIUM BROMIDE 100 MG/10ML IV SOLN
INTRAVENOUS | Status: DC | PRN
  Administered 2017-11-24: 20 mg via INTRAVENOUS

## 2017-11-24 MED ORDER — ONDANSETRON HCL 4 MG/2ML IJ SOLN: 4.0000 mg | Freq: Once | INTRAMUSCULAR | Status: DC | PRN

## 2017-11-24 MED ORDER — ONDANSETRON HCL 4 MG/2ML IJ SOLN
INTRAMUSCULAR | Status: DC | PRN
Start: 2017-11-24 — End: 2017-11-24
  Administered 2017-11-24: 4 mg via INTRAVENOUS

## 2017-11-24 MED ORDER — EPHEDRINE SULFATE 50 MG/ML IJ SOLN
INTRAMUSCULAR | Status: DC | PRN
  Administered 2017-11-24 (×2): 10 mg via INTRAVENOUS

## 2017-11-24 MED ORDER — ROCURONIUM BROMIDE 50 MG/5ML IV SOLN
INTRAVENOUS | Status: AC
  Filled 2017-11-24: qty 1

## 2017-11-24 MED ORDER — SUGAMMADEX SODIUM 200 MG/2ML IV SOLN
INTRAVENOUS | Status: AC
  Filled 2017-11-24: qty 2

## 2017-11-24 MED ORDER — LIDOCAINE HCL (CARDIAC) PF 100 MG/5ML IV SOSY
PREFILLED_SYRINGE | INTRAVENOUS | Status: DC | PRN
  Administered 2017-11-24: 80 mg via INTRAVENOUS

## 2017-11-24 MED ORDER — PROPOFOL 10 MG/ML IV BOLUS
INTRAVENOUS | Status: DC | PRN
  Administered 2017-11-24: 130 mg via INTRAVENOUS

## 2017-11-24 MED ORDER — STERILE WATER FOR INJECTION IJ SOLN
INTRAMUSCULAR | Status: AC
  Filled 2017-11-24: qty 10

## 2017-11-24 MED ORDER — LACTATED RINGERS IV SOLN
INTRAVENOUS | Status: DC
  Administered 2017-11-24: 07:00:00 via INTRAVENOUS

## 2017-11-24 MED ORDER — LIDOCAINE HCL (PF) 2 % IJ SOLN
INTRAMUSCULAR | Status: AC
  Filled 2017-11-24: qty 10

## 2017-11-24 MED ORDER — DEXAMETHASONE SODIUM PHOSPHATE 10 MG/ML IJ SOLN
INTRAMUSCULAR | Status: AC
  Filled 2017-11-24: qty 1

## 2017-11-24 MED ORDER — GLYCOPYRROLATE 0.2 MG/ML IJ SOLN
INTRAMUSCULAR | Status: AC
  Filled 2017-11-24: qty 1

## 2017-11-24 MED ORDER — FENTANYL CITRATE (PF) 100 MCG/2ML IJ SOLN
INTRAMUSCULAR | Status: AC
  Filled 2017-11-24: qty 2

## 2017-11-24 MED ORDER — SUCCINYLCHOLINE CHLORIDE 20 MG/ML IJ SOLN
INTRAMUSCULAR | Status: DC | PRN
  Administered 2017-11-24: 140 mg via INTRAVENOUS

## 2017-11-24 MED ORDER — EPHEDRINE SULFATE 50 MG/ML IJ SOLN
INTRAMUSCULAR | Status: AC
  Filled 2017-11-24: qty 1

## 2017-11-24 MED ORDER — LIDOCAINE HCL (PF) 4 % IJ SOLN
INTRAMUSCULAR | Status: DC | PRN
  Administered 2017-11-24: 160 mg via INTRADERMAL

## 2017-11-24 MED ORDER — PHENYLEPHRINE HCL 10 % OP SOLN
OPHTHALMIC | Status: DC | PRN
  Administered 2017-11-24: 10 mL via TOPICAL

## 2017-11-24 MED ORDER — FENTANYL CITRATE (PF) 100 MCG/2ML IJ SOLN
INTRAMUSCULAR | Status: DC | PRN
  Administered 2017-11-24 (×2): 50 ug via INTRAVENOUS

## 2017-11-24 MED ORDER — PHENYLEPHRINE HCL 10 MG/ML IJ SOLN
INTRAMUSCULAR | Status: AC
  Filled 2017-11-24: qty 1

## 2017-11-24 MED ORDER — FENTANYL CITRATE (PF) 100 MCG/2ML IJ SOLN: 25.0000 ug | INTRAMUSCULAR | Status: DC | PRN

## 2017-11-24 SURGICAL SUPPLY — 20 items
BASIN GRAD PLASTIC 32OZ STRL (MISCELLANEOUS) ×3 IMPLANT
CNTNR SPEC 2.5X3XGRAD LEK (MISCELLANEOUS) ×1
CONT SPEC 4OZ STER OR WHT (MISCELLANEOUS) ×2
CONTAINER SPEC 2.5X3XGRAD LEK (MISCELLANEOUS) ×1 IMPLANT
DRAPE TABLE BACK 80X90 (DRAPES) ×3 IMPLANT
DRSG TELFA 4X3 1S NADH ST (GAUZE/BANDAGES/DRESSINGS) ×3 IMPLANT
GAUZE SPONGE 4X4 12PLY STRL (GAUZE/BANDAGES/DRESSINGS) ×3 IMPLANT
GLOVE PROTEXIS LATEX SZ 7.5 (GLOVE) ×6 IMPLANT
GOWN STRL REUS W/ TWL LRG LVL3 (GOWN DISPOSABLE) ×1 IMPLANT
GOWN STRL REUS W/TWL LRG LVL3 (GOWN DISPOSABLE) ×2
KIT TURNOVER KIT A (KITS) ×3 IMPLANT
NDL SAFETY ECLIPSE 18X1.5 (NEEDLE) ×1 IMPLANT
NEEDLE FILTER BLUNT 18X 1/2SAF (NEEDLE) ×2
NEEDLE FILTER BLUNT 18X1 1/2 (NEEDLE) ×1 IMPLANT
NEEDLE HYPO 18GX1.5 SHARP (NEEDLE) ×2
PATTIES SURGICAL .5 X.5 (GAUZE/BANDAGES/DRESSINGS) ×3 IMPLANT
SOL ANTI-FOG 6CC FOG-OUT (MISCELLANEOUS) ×1 IMPLANT
SOL FOG-OUT ANTI-FOG 6CC (MISCELLANEOUS) ×2
TUBING CONNECTING 10 (TUBING) ×2 IMPLANT
TUBING CONNECTING 10' (TUBING) ×1

## 2017-11-24 NOTE — Anesthesia Post-op Follow-up Note (Signed)
Anesthesia QCDR form completed.        

## 2017-11-24 NOTE — Transfer of Care (Signed)
Immediate Anesthesia Transfer of Care Note  Patient: Shane Simmons  Procedure(s) Performed: DIRECT LARYNGOSCOPY LEFT VOCAL CORD WITH BIOPSY (Left )  Patient Location: PACU  Anesthesia Type:General  Level of Consciousness: awake, alert  and oriented  Airway & Oxygen Therapy: Patient Spontanous Breathing and Patient connected to face mask oxygen  Post-op Assessment: Report given to RN and Post -op Vital signs reviewed and stable  Post vital signs: Reviewed and stable  Last Vitals:  Vitals Value Taken Time  BP 102/70 11/24/2017  8:26 AM  Temp    Pulse 90 11/24/2017  8:26 AM  Resp 17 11/24/2017  8:26 AM  SpO2 99 % 11/24/2017  8:26 AM  Vitals shown include unvalidated device data.  Last Pain:  Vitals:   11/24/17 0626  TempSrc: Tympanic         Complications: No apparent anesthesia complications

## 2017-11-24 NOTE — H&P (Signed)
H&P has been reviewedand patient reevaluated,  and no changes necessary. To be downloaded later.  

## 2017-11-24 NOTE — Anesthesia Postprocedure Evaluation (Signed)
Anesthesia Post Note  Patient: Alexsis Branscom  Procedure(s) Performed: DIRECT LARYNGOSCOPY LEFT VOCAL CORD WITH BIOPSY (Left )  Patient location during evaluation: PACU Anesthesia Type: General Level of consciousness: awake and alert Pain management: pain level controlled Vital Signs Assessment: post-procedure vital signs reviewed and stable Respiratory status: spontaneous breathing, nonlabored ventilation, respiratory function stable and patient connected to nasal cannula oxygen Cardiovascular status: blood pressure returned to baseline and stable Postop Assessment: no apparent nausea or vomiting Anesthetic complications: no     Last Vitals:  Vitals:   11/24/17 0902 11/24/17 0923  BP: (!) 82/50 (!) 79/57  Pulse: 91 81  Resp: 16 16  Temp: (!) 35.9 C   SpO2: 94% 96%    Last Pain:  Vitals:   11/24/17 0923  TempSrc:   PainSc: 0-No pain                 Larkin Alfred S

## 2017-11-24 NOTE — Progress Notes (Signed)
Non productive cough

## 2017-11-24 NOTE — Anesthesia Preprocedure Evaluation (Addendum)
Anesthesia Evaluation  Patient identified by MRN, date of birth, ID band Patient awake    Reviewed: Allergy & Precautions, NPO status , Patient's Chart, lab work & pertinent test results, reviewed documented beta blocker date and time   Airway Mallampati: II  TM Distance: >3 FB     Dental  (+) Chipped, Upper Dentures, Lower Dentures   Pulmonary COPD, Current Smoker,           Cardiovascular hypertension, Pt. on medications + Peripheral Vascular Disease  + dysrhythmias Atrial Fibrillation      Neuro/Psych PSYCHIATRIC DISORDERS Depression CVA, Residual Symptoms    GI/Hepatic GERD  Controlled,  Endo/Other    Renal/GU Renal disease     Musculoskeletal  (+) Arthritis ,   Abdominal   Peds  Hematology   Anesthesia Other Findings CXR shows mild pleural effusion. K 3.3. Has had AFib since 2013.  Reproductive/Obstetrics                            Anesthesia Physical Anesthesia Plan  ASA: III  Anesthesia Plan: General   Post-op Pain Management:    Induction: Intravenous  PONV Risk Score and Plan:   Airway Management Planned: Oral ETT  Additional Equipment:   Intra-op Plan:   Post-operative Plan:   Informed Consent: I have reviewed the patients History and Physical, chart, labs and discussed the procedure including the risks, benefits and alternatives for the proposed anesthesia with the patient or authorized representative who has indicated his/her understanding and acceptance.     Plan Discussed with: CRNA  Anesthesia Plan Comments:         Anesthesia Quick Evaluation

## 2017-11-24 NOTE — Anesthesia Procedure Notes (Signed)
Procedure Name: Intubation Date/Time: 11/24/2017 7:31 AM Performed by: Loleta Books, RN Pre-anesthesia Checklist: Patient identified, Patient being monitored, Timeout performed, Emergency Drugs available and Suction available Patient Re-evaluated:Patient Re-evaluated prior to induction Oxygen Delivery Method: Circle system utilized Preoxygenation: Pre-oxygenation with 100% oxygen Induction Type: IV induction Ventilation: Mask ventilation without difficulty Laryngoscope Size: Mac and 4 Grade View: Grade I Tube type: MLT Tube size: 6.0 mm Number of attempts: 1 Placement Confirmation: ETT inserted through vocal cords under direct vision,  positive ETCO2 and breath sounds checked- equal and bilateral Secured at: 21 cm Tube secured with: Tape Dental Injury: Teeth and Oropharynx as per pre-operative assessment

## 2017-11-24 NOTE — Op Note (Signed)
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  There is fixation of the left arytenoid and immobility of the left cord.  The right cord looked normal.  This extended down to the inferior portion of the left cord near the subglottic area.    Procedure: The patient was brought to the operating room and placed in a supine position.  He was given general anesthesia with a size 6 oral endotracheal tube with a large cuff.  A Dedo laryngoscope was used for visualization the hypopharynx and larynx.  The epiglottis looked normal and the vallecula was clear.  Both piriform sinuses were clear.  The larynx showed the right true cord to be clear although there was a growth of exophytic lesion at the anterior commissure and involving the entire left true cord down towards the subglottic area.  Involve the ventricle and part of the false cord on the left as well.  It extended to the arytenoid and covered the left arytenoid with immobility of the arytenoid.  A 0 degrees scope was used for direct visualization and magnification of the lesion.  Pictures were taken of this.  An up-biting microcup forcep was used for removing the tissue at the anterior commissure and taking pieces of the left true cord.  This debulked much of the tumor that was extending into the airway.  Biopsy was taken of some lesion over the arytenoid as well as I lifted up the endotracheal tube to get underneath it and visualize this area as well.  The interarytenoid area was not involved nor was the right arytenoid.  Once the biopsies were taken a cottonoid pledget  soaked in phenylephrine was placed here for vasoconstriction.  There is no significant bleeding.  A further picture was taken once that it was debulked.  I palpated the neck and could not feel any lymph nodes in the neck at all.  The patient was awakened and taken to the recovery room in satisfactory condition.  There were no operative complications.  He tolerated the procedure well.  Dispo:   To PACU to be discharged home  Plan: To follow-up in the office in 1 week to go over path report and decide upon a treatment plan.  Elon Alas Keny Donald  11/24/2017 8:15 AM

## 2017-11-25 ENCOUNTER — Encounter: Payer: Self-pay | Admitting: Otolaryngology

## 2017-11-25 LAB — SURGICAL PATHOLOGY

## 2017-11-26 ENCOUNTER — Other Ambulatory Visit: Payer: Self-pay | Admitting: Oncology

## 2017-11-26 DIAGNOSIS — C329 Malignant neoplasm of larynx, unspecified: Secondary | ICD-10-CM | POA: Diagnosis not present

## 2017-11-26 DIAGNOSIS — J3800 Paralysis of vocal cords and larynx, unspecified: Secondary | ICD-10-CM | POA: Diagnosis not present

## 2017-11-26 DIAGNOSIS — C760 Malignant neoplasm of head, face and neck: Secondary | ICD-10-CM

## 2017-11-28 ENCOUNTER — Other Ambulatory Visit: Payer: Self-pay

## 2017-11-28 ENCOUNTER — Telehealth: Payer: Self-pay

## 2017-11-28 DIAGNOSIS — N3000 Acute cystitis without hematuria: Secondary | ICD-10-CM

## 2017-11-28 MED ORDER — NITROFURANTOIN MONOHYD MACRO 100 MG PO CAPS: 100.0000 mg | ORAL_CAPSULE | Freq: Two times a day (BID) | ORAL | 0 refills | Status: AC

## 2017-11-28 NOTE — Telephone Encounter (Signed)
Wife called with pt having UTI- sx of frequent ruination, urine smells- sent in nitrofurantoin bid x 7 days

## 2017-12-01 ENCOUNTER — Telehealth: Payer: Self-pay | Admitting: Oncology

## 2017-12-01 NOTE — Telephone Encounter (Signed)
Was advised on Friday evening by Shane Simmons (covering for Shane Simmons) that I would need to call patient's referring Dr. Runell Simmons is Shane Sheffield MD and advised them to fax a copy of the patient's most recent Labs and Medical Records along with a paper referral to Shane Simmons/New Patient Coordinator to Shane Simmons attention via her fax number. Called PCP office multiple times, this morning and was unsuccessful. Left msg on PCP office VM. Advised Shane Simmons of outcome. Also call patient and advised that I was unable to reach referring office for requested documents. Message also sent to Shane Simmons.

## 2017-12-01 NOTE — Telephone Encounter (Signed)
Rcvd Msg via scheduling pool from Dr Woodfin Ganja to schedule a New Patient from Dr. Margaretha Sheffield MD to him. Also, to schedule a PET Scan for Monday 07/08 or Tuesday 07/09 (with Dr. Woodfin Ganja and Dr. Donella Stade appt 1 - 2 days later. PET Scan was schd and conf with patient for Tuesday 12/02/17 @ 8:30 a.m. on 11/26/17, per schd/msg request. New patient info was fwd to Melissa/New Pt Co-ordinator on 11/26/17 for scheduling.  Also s/w Melissa and advised of request sent by Dr. Grayland Ormond and advised that I had scheduled the PET for Tuesday 12/02/17 as requestd and that appt was conf w patient.

## 2017-12-02 ENCOUNTER — Encounter
Admission: RE | Admit: 2017-12-02 | Discharge: 2017-12-02 | Disposition: A | Discharge status: Home / Self Care | Payer: Medicare Other | Source: Ambulatory Visit | Attending: Oncology | Admitting: Oncology

## 2017-12-02 DIAGNOSIS — C760 Malignant neoplasm of head, face and neck: Secondary | ICD-10-CM | POA: Diagnosis not present

## 2017-12-02 DIAGNOSIS — C329 Malignant neoplasm of larynx, unspecified: Secondary | ICD-10-CM | POA: Diagnosis not present

## 2017-12-02 LAB — GLUCOSE, CAPILLARY: GLUCOSE-CAPILLARY: 97 mg/dL (ref 70–99)

## 2017-12-02 MED ORDER — FLUDEOXYGLUCOSE F - 18 (FDG) INJECTION
8.6700 | Freq: Once | INTRAVENOUS | Status: AC | PRN
  Administered 2017-12-02: 8.67 via INTRAVENOUS

## 2017-12-05 ENCOUNTER — Encounter: Payer: Self-pay | Admitting: Oncology

## 2017-12-05 ENCOUNTER — Other Ambulatory Visit: Payer: Self-pay

## 2017-12-05 ENCOUNTER — Inpatient Hospital Stay: Payer: Medicare Other | Attending: Oncology | Admitting: Oncology

## 2017-12-05 VITALS — BP 95/59 | HR 50 | Temp 96.5°F | Resp 20 | Wt 172.6 lb

## 2017-12-05 DIAGNOSIS — K5730 Diverticulosis of large intestine without perforation or abscess without bleeding: Secondary | ICD-10-CM | POA: Insufficient documentation

## 2017-12-05 DIAGNOSIS — I10 Essential (primary) hypertension: Secondary | ICD-10-CM | POA: Insufficient documentation

## 2017-12-05 DIAGNOSIS — F17210 Nicotine dependence, cigarettes, uncomplicated: Secondary | ICD-10-CM | POA: Diagnosis not present

## 2017-12-05 DIAGNOSIS — Z905 Acquired absence of kidney: Secondary | ICD-10-CM | POA: Diagnosis not present

## 2017-12-05 DIAGNOSIS — C320 Malignant neoplasm of glottis: Secondary | ICD-10-CM

## 2017-12-05 DIAGNOSIS — N400 Enlarged prostate without lower urinary tract symptoms: Secondary | ICD-10-CM | POA: Diagnosis not present

## 2017-12-05 DIAGNOSIS — I700 Atherosclerosis of aorta: Secondary | ICD-10-CM | POA: Diagnosis not present

## 2017-12-05 DIAGNOSIS — Z79899 Other long term (current) drug therapy: Secondary | ICD-10-CM | POA: Insufficient documentation

## 2017-12-05 DIAGNOSIS — N323 Diverticulum of bladder: Secondary | ICD-10-CM | POA: Insufficient documentation

## 2017-12-05 DIAGNOSIS — E785 Hyperlipidemia, unspecified: Secondary | ICD-10-CM | POA: Insufficient documentation

## 2017-12-05 DIAGNOSIS — N1330 Unspecified hydronephrosis: Secondary | ICD-10-CM | POA: Insufficient documentation

## 2017-12-05 DIAGNOSIS — Z8673 Personal history of transient ischemic attack (TIA), and cerebral infarction without residual deficits: Secondary | ICD-10-CM | POA: Insufficient documentation

## 2017-12-05 DIAGNOSIS — K219 Gastro-esophageal reflux disease without esophagitis: Secondary | ICD-10-CM | POA: Insufficient documentation

## 2017-12-05 DIAGNOSIS — J449 Chronic obstructive pulmonary disease, unspecified: Secondary | ICD-10-CM | POA: Diagnosis not present

## 2017-12-05 DIAGNOSIS — J320 Chronic maxillary sinusitis: Secondary | ICD-10-CM | POA: Diagnosis not present

## 2017-12-05 DIAGNOSIS — I2510 Atherosclerotic heart disease of native coronary artery without angina pectoris: Secondary | ICD-10-CM | POA: Diagnosis not present

## 2017-12-05 DIAGNOSIS — J90 Pleural effusion, not elsewhere classified: Secondary | ICD-10-CM | POA: Insufficient documentation

## 2017-12-05 DIAGNOSIS — K8020 Calculus of gallbladder without cholecystitis without obstruction: Secondary | ICD-10-CM | POA: Diagnosis not present

## 2017-12-05 DIAGNOSIS — Z7901 Long term (current) use of anticoagulants: Secondary | ICD-10-CM | POA: Diagnosis not present

## 2017-12-05 DIAGNOSIS — Z87442 Personal history of urinary calculi: Secondary | ICD-10-CM | POA: Insufficient documentation

## 2017-12-05 DIAGNOSIS — C329 Malignant neoplasm of larynx, unspecified: Secondary | ICD-10-CM

## 2017-12-05 NOTE — Progress Notes (Signed)
Here for new pt evaluation. Daughter and wife gave most info. Pt diff speaking w coarse raspy voice.

## 2017-12-09 NOTE — Patient Instructions (Signed)
Cisplatin injection  What is this medicine?  CISPLATIN (SIS pla tin) is a chemotherapy drug. It targets fast dividing cells, like cancer cells, and causes these cells to die. This medicine is used to treat many types of cancer like bladder, ovarian, and testicular cancers.  This medicine may be used for other purposes; ask your health care provider or pharmacist if you have questions.  COMMON BRAND NAME(S): Platinol, Platinol -AQ  What should I tell my health care provider before I take this medicine?  They need to know if you have any of these conditions:  -blood disorders  -hearing problems  -kidney disease  -recent or ongoing radiation therapy  -an unusual or allergic reaction to cisplatin, carboplatin, other chemotherapy, other medicines, foods, dyes, or preservatives  -pregnant or trying to get pregnant  -breast-feeding  How should I use this medicine?  This drug is given as an infusion into a vein. It is administered in a hospital or clinic by a specially trained health care professional.  Talk to your pediatrician regarding the use of this medicine in children. Special care may be needed.  Overdosage: If you think you have taken too much of this medicine contact a poison control center or emergency room at once.  NOTE: This medicine is only for you. Do not share this medicine with others.  What if I miss a dose?  It is important not to miss a dose. Call your doctor or health care professional if you are unable to keep an appointment.  What may interact with this medicine?  -dofetilide  -foscarnet  -medicines for seizures  -medicines to increase blood counts like filgrastim, pegfilgrastim, sargramostim  -probenecid  -pyridoxine used with altretamine  -rituximab  -some antibiotics like amikacin, gentamicin, neomycin, polymyxin B, streptomycin, tobramycin  -sulfinpyrazone  -vaccines  -zalcitabine  Talk to your doctor or health care professional before taking any of these  medicines:  -acetaminophen  -aspirin  -ibuprofen  -ketoprofen  -naproxen  This list may not describe all possible interactions. Give your health care provider a list of all the medicines, herbs, non-prescription drugs, or dietary supplements you use. Also tell them if you smoke, drink alcohol, or use illegal drugs. Some items may interact with your medicine.  What should I watch for while using this medicine?  Your condition will be monitored carefully while you are receiving this medicine. You will need important blood work done while you are taking this medicine.  This drug may make you feel generally unwell. This is not uncommon, as chemotherapy can affect healthy cells as well as cancer cells. Report any side effects. Continue your course of treatment even though you feel ill unless your doctor tells you to stop.  In some cases, you may be given additional medicines to help with side effects. Follow all directions for their use.  Call your doctor or health care professional for advice if you get a fever, chills or sore throat, or other symptoms of a cold or flu. Do not treat yourself. This drug decreases your body's ability to fight infections. Try to avoid being around people who are sick.  This medicine may increase your risk to bruise or bleed. Call your doctor or health care professional if you notice any unusual bleeding.  Be careful brushing and flossing your teeth or using a toothpick because you may get an infection or bleed more easily. If you have any dental work done, tell your dentist you are receiving this medicine.  Avoid taking products   that contain aspirin, acetaminophen, ibuprofen, naproxen, or ketoprofen unless instructed by your doctor. These medicines may hide a fever.  Do not become pregnant while taking this medicine. Women should inform their doctor if they wish to become pregnant or think they might be pregnant. There is a potential for serious side effects to an unborn child. Talk to  your health care professional or pharmacist for more information. Do not breast-feed an infant while taking this medicine.  Drink fluids as directed while you are taking this medicine. This will help protect your kidneys.  Call your doctor or health care professional if you get diarrhea. Do not treat yourself.  What side effects may I notice from receiving this medicine?  Side effects that you should report to your doctor or health care professional as soon as possible:  -allergic reactions like skin rash, itching or hives, swelling of the face, lips, or tongue  -signs of infection - fever or chills, cough, sore throat, pain or difficulty passing urine  -signs of decreased platelets or bleeding - bruising, pinpoint red spots on the skin, black, tarry stools, nosebleeds  -signs of decreased red blood cells - unusually weak or tired, fainting spells, lightheadedness  -breathing problems  -changes in hearing  -gout pain  -low blood counts - This drug may decrease the number of white blood cells, red blood cells and platelets. You may be at increased risk for infections and bleeding.  -nausea and vomiting  -pain, swelling, redness or irritation at the injection site  -pain, tingling, numbness in the hands or feet  -problems with balance, movement  -trouble passing urine or change in the amount of urine  Side effects that usually do not require medical attention (report to your doctor or health care professional if they continue or are bothersome):  -changes in vision  -loss of appetite  -metallic taste in the mouth or changes in taste  This list may not describe all possible side effects. Call your doctor for medical advice about side effects. You may report side effects to FDA at 1-800-FDA-1088.  Where should I keep my medicine?  This drug is given in a hospital or clinic and will not be stored at home.  NOTE: This sheet is a summary. It may not cover all possible information. If you have questions about this medicine,  talk to your doctor, pharmacist, or health care provider.   2018 [employer]vier/Gold Standard (2007-08-18 14:40:54)

## 2017-12-10 ENCOUNTER — Ambulatory Visit
Admission: RE | Admit: 2017-12-10 | Discharge: 2017-12-10 | Disposition: A | Discharge status: Home / Self Care | Payer: Medicare Other | Source: Ambulatory Visit | Attending: Radiation Oncology | Admitting: Radiation Oncology

## 2017-12-10 ENCOUNTER — Other Ambulatory Visit: Payer: Self-pay

## 2017-12-10 ENCOUNTER — Encounter: Payer: Self-pay | Admitting: Radiation Oncology

## 2017-12-10 ENCOUNTER — Inpatient Hospital Stay: Payer: Medicare Other

## 2017-12-10 VITALS — BP 89/59 | HR 92 | Temp 98.9°F | Wt 177.0 lb

## 2017-12-10 DIAGNOSIS — K219 Gastro-esophageal reflux disease without esophagitis: Secondary | ICD-10-CM | POA: Insufficient documentation

## 2017-12-10 DIAGNOSIS — C320 Malignant neoplasm of glottis: Secondary | ICD-10-CM

## 2017-12-10 DIAGNOSIS — I10 Essential (primary) hypertension: Secondary | ICD-10-CM | POA: Insufficient documentation

## 2017-12-10 DIAGNOSIS — Z7901 Long term (current) use of anticoagulants: Secondary | ICD-10-CM | POA: Insufficient documentation

## 2017-12-10 DIAGNOSIS — E785 Hyperlipidemia, unspecified: Secondary | ICD-10-CM | POA: Diagnosis not present

## 2017-12-10 DIAGNOSIS — Z809 Family history of malignant neoplasm, unspecified: Secondary | ICD-10-CM | POA: Insufficient documentation

## 2017-12-10 DIAGNOSIS — R918 Other nonspecific abnormal finding of lung field: Secondary | ICD-10-CM | POA: Diagnosis not present

## 2017-12-10 DIAGNOSIS — F17210 Nicotine dependence, cigarettes, uncomplicated: Secondary | ICD-10-CM | POA: Insufficient documentation

## 2017-12-10 DIAGNOSIS — Z79899 Other long term (current) drug therapy: Secondary | ICD-10-CM | POA: Insufficient documentation

## 2017-12-10 DIAGNOSIS — M129 Arthropathy, unspecified: Secondary | ICD-10-CM | POA: Insufficient documentation

## 2017-12-10 DIAGNOSIS — N400 Enlarged prostate without lower urinary tract symptoms: Secondary | ICD-10-CM | POA: Insufficient documentation

## 2017-12-10 DIAGNOSIS — Z8042 Family history of malignant neoplasm of prostate: Secondary | ICD-10-CM | POA: Diagnosis not present

## 2017-12-10 DIAGNOSIS — Z87442 Personal history of urinary calculi: Secondary | ICD-10-CM | POA: Diagnosis not present

## 2017-12-10 DIAGNOSIS — J449 Chronic obstructive pulmonary disease, unspecified: Secondary | ICD-10-CM | POA: Diagnosis not present

## 2017-12-10 DIAGNOSIS — C329 Malignant neoplasm of larynx, unspecified: Secondary | ICD-10-CM | POA: Diagnosis not present

## 2017-12-10 DIAGNOSIS — I499 Cardiac arrhythmia, unspecified: Secondary | ICD-10-CM | POA: Insufficient documentation

## 2017-12-10 DIAGNOSIS — Z8673 Personal history of transient ischemic attack (TIA), and cerebral infarction without residual deficits: Secondary | ICD-10-CM | POA: Insufficient documentation

## 2017-12-10 NOTE — Consult Note (Signed)
NEW PATIENT EVALUATION  Name: Shane Simmons  MRN: [id number]  Date:   12/10/2017     DOB: 1935-12-26   This 82 y.o. 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  REFERRING PHYSICIAN: Juline Patch, MD  CHIEF COMPLAINT:  Chief Complaint  Patient presents with  . 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   PREVIOUS INVESTIGATIONS:  PET/CT and CT scan reviewed Pathology reviewed Clinical notes and operative reviewed  HPI: patient is an 82 year old male who presented with chronic dysphasia as well as hoarseness He was found by ENT to have vocal cord paralysis with fixation of the left cord. He was noted to have a tumor involving the left arytenoid up to the anterior commissure. At the time of endoscopy tumor involving anterior commissure all the way down covering the left arytenoid with fixation of the left arytenoid and mobility of the left cord. Biopsy was positive for squamous cell carcinoma invasive.patient underwent a PET CT scanshowing left glottic mass compatible with known malignancy. There was no adenopathy in the neck or any evidence of distal metastatic disease. There was a 6 mm upper lobe pulmonary nodule which recommendation was for surveillance. He also did have some prostamegaly. 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 He states he is having no trouble with swallowing at this time although according to his daughter he does have dysphagia. He has been seen by medical oncology with recommendation for concurrent chemoradiation. He is seen today for radiation oncology opinion.  PLANNED TREATMENT REGIMEN: oncurrent chemoradiation  PAST MEDICAL HISTORY:  has a past medical history of Arthritis, Cancer ([place]), COPD (chronic obstructive pulmonary disease) ([place]), Dysphagia, Dysrhythmia, GERD (gastroesophageal reflux  disease), History of kidney stones, Hyperlipidemia, Hypertension, Stroke ([place]), and Vocal cord paralysis.    PAST SURGICAL HISTORY:  Past Surgical History:  Procedure Laterality Date  . APPENDECTOMY    . DIRECT LARYNGOSCOPY Left 11/24/2017   Procedure: DIRECT LARYNGOSCOPY LEFT VOCAL CORD WITH BIOPSY;  Surgeon: Margaretha Sheffield, MD;  Location: [facility] ORS;  Service: ENT;  Laterality: Left;  . HERNIA REPAIR     abd  . kidney removed     due to cancer  . PEG TUBE PLACEMENT     has been removed  . pleurex cath     has been removed    FAMILY HISTORY: family history includes COPD in his mother; Cancer in his maternal grandmother; Diabetes in his brother and mother; Prostate cancer in his brother; Stroke in his sister.  SOCIAL HISTORY:  reports that he has been smoking cigarettes.  He has a 65.00 pack-year smoking history. He quit smokeless tobacco use about 19 years ago. His smokeless tobacco use included chew. He reports that he drinks about 0.6 oz of alcohol per week. He reports that he does not use drugs.  ALLERGIES: Patient has no known allergies.  MEDICATIONS:  Current Outpatient Medications  Medication Sig Dispense Refill  . acetaminophen (TYLENOL) 325 MG tablet Take 650 mg by mouth 3 (three) times daily as needed for moderate pain or headache.     Marland Kitchen atorvastatin (LIPITOR) 40 MG tablet Take 1 tablet (40 mg total) by mouth daily. (Patient taking differently: Take 40 mg by mouth at bedtime. ) 90 tablet 1  . carbamide peroxide (DEBROX) 6.5 % OTIC solution Place 5 drops into both ears daily as  needed (earwax).    . diltiazem (CARDIZEM) 60 MG tablet Take 1 tablet (60 mg total) by mouth 2 (two) times daily. 180 tablet 1  . finasteride (PROSCAR) 5 MG tablet Take 5 mg by mouth at bedtime.    . furosemide (LASIX) 20 MG tablet Take 1 tablet (20 mg total) by mouth daily. 90 tablet 1  . HYDROcodone-acetaminophen (NORCO/VICODIN) 5-325 MG tablet TAKE 1 TO 2 TABLETS BY MOUTH EVERY 6 HOURS AS NEEDED FOR  SEVERE PAIN  0  . naproxen sodium (ALEVE) 220 MG tablet Take 220 mg by mouth daily as needed.    . nitrofurantoin, macrocrystal-monohydrate, (MACROBID) 100 MG capsule Take 1 capsule (100 mg total) by mouth 2 (two) times daily. (Patient not taking: Reported on 12/05/2017) 14 capsule 0  . QUEtiapine (SEROQUEL) 25 MG tablet Take 25 mg by mouth 2 (two) times daily.     . ranitidine (ZANTAC) 300 MG tablet Take 1 tablet (300 mg total) by mouth daily. 90 tablet 1  . Senna-Fennel (NATURAL VEGETABLE LAXATIVE PO) Take 1 tablet by mouth daily.    . tamsulosin (FLOMAX) 0.4 MG CAPS capsule Take 1 capsule (0.4 mg total) by mouth daily. 90 capsule 1  . tiZANidine (ZANAFLEX) 2 MG tablet Take 1 mg by mouth at bedtime as needed for muscle spasms.     Marland Kitchen warfarin (COUMADIN) 2 MG tablet TAKE 1 TABLET BY MOUTH ONCE DAILY (Patient taking differently: Take 1 mg by mouth once daily in the evening) 60 tablet 1   No current facility-administered medications for this encounter.     ECOG PERFORMANCE STATUS:  2 - Symptomatic, <50% confined to bed  REVIEW OF SYSTEMS: except for the prior CVA with hemiparesis patient does tend towards drooping to the left side. Patient denies any weight loss, fatigue, weakness, fever, chills or night sweats. Patient denies any loss of vision, blurred vision. Patient denies any ringing  of the ears or hearing loss. No irregular heartbeat. Patient denies heart murmur or history of fainting. Patient denies any chest pain or pain radiating to her upper extremities. Patient denies any shortness of breath, difficulty breathing at night, cough or hemoptysis. Patient denies any swelling in the lower legs. Patient denies any nausea vomiting, vomiting of blood, or coffee ground material in the vomitus. Patient denies any stomach pain. Patient states has had normal bowel movements no significant constipation or diarrhea. Patient denies any dysuria, hematuria or significant nocturia. Patient denies any problems  walking, swelling in the joints or loss of balance. Patient denies any skin changes, loss of hair or loss of weight. Patient denies any excessive worrying or anxiety or significant depression. Patient denies any problems with insomnia. Patient denies excessive thirst, polyuria, polydipsia. Patient denies any swollen glands, patient denies easy bruising or easy bleeding. Patient denies any recent infections, allergies or URI. Patient "s visual fields have not changed significantly in recent time.    PHYSICAL EXAM: BP (!) 89/59   Pulse 92   Temp 98.9 F (37.2 C)   Wt 177 lb 0.5 oz (80.3 kg)   BMI 26.92 kg/m  Wheelchair-bound male with obvious left sided hemiparesis. Oral cavity is clear. Patient has a hard time with oral examination although upper airway appears clear with base of tongue appearing within normal limits. No evidence of subject gastric cervical or supraclavicular adenopathy is identified.Well-developed well-nourished patient in NAD. HEENT reveals PERLA, EOMI, discs not visualized.  Oral cavity is clear. No oral mucosal lesions are identified. Neck is clear without evidence  of cervical or supraclavicular adenopathy. Lungs are clear to A&P. Cardiac examination is essentially unremarkable with regular rate and rhythm without murmur rub or thrill. Abdomen is benign with no organomegaly or masses noted. Motor sensory and DTR levels are equal and symmetric in the upper and lower extremities. Cranial nerves II through XII are grossly intact. Proprioception is intact. No peripheral adenopathy or edema is identified. No motor or sensory levels are noted. Crude visual fields are within normal range.  LABORATORY DATA: athology reports reviewed    RADIOLOGY RESULTS:pET CT scan reviewed and compatible with the above-stated findings   IMPRESSION: a stage IIIa squamous cell carcinoma of the larynx in 82 year old male  PLAN: this time I to go ahead with radiation therapy with concurrent  chemotherapy. Would plan on delivering 7000 cGy over 7 weeks this large bulky left cord fixating lesion. Risks and benefits of treatment including increased hoarseness dysphasia sore throat skin reaction fatigue alteration of blood counts all were described in detail to the patient and his family.There will be extra effort by both professional staff as well as technical staff to coordinate and manage concurrent chemoradiation and ensuing side effects during his treatments.  I have personally set up and ordered CT simulation for next week. We'll coordinate his chemotherapy with medical oncology. Family and patient seem to comprehend my treatment plan well.  I would like to take this opportunity to thank you for allowing me to participate in the care of your patient.Noreene Filbert, MD

## 2017-12-11 DIAGNOSIS — C329 Malignant neoplasm of larynx, unspecified: Secondary | ICD-10-CM | POA: Insufficient documentation

## 2017-12-11 MED ORDER — ONDANSETRON HCL 8 MG PO TABS: 8.0000 mg | ORAL_TABLET | Freq: Two times a day (BID) | ORAL | 2 refills | Status: AC | PRN

## 2017-12-11 MED ORDER — PROCHLORPERAZINE MALEATE 10 MG PO TABS: 10.0000 mg | ORAL_TABLET | Freq: Four times a day (QID) | ORAL | 1 refills | Status: AC | PRN

## 2017-12-11 NOTE — Progress Notes (Signed)
START ON PATHWAY REGIMEN - Head and Neck     A cycle is every 7 days:     Cisplatin   **Always confirm dose/schedule in your pharmacy ordering system**  Patient Characteristics: Larynx, Stage III, IVA, IVB; Unresectable Disease Classification: Larynx Current Disease Status: No Distant Metastases and No Recurrent Disease AJCC T Category: T3 AJCC 8 Stage Grouping: III AJCC N Category: cN0 AJCC M Category: M0 Intent of Therapy: Curative Intent, Discussed with Patient 

## 2017-12-11 NOTE — Progress Notes (Signed)
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  INTERVAL HISTORY: Patient is an 82 year old male who noted increasing raspy voice, hoarseness, and difficulty swallowing.  Subsequent work-up by ENT revealed vocal cord paralysis as well as mass which was confirmed to be malignancy on biopsy.  Much of the history is given by his wife and daughter.  He has no neurologic complaints.  He denies any recent fevers or illnesses.  He has a poor appetite and admits to weight loss, but unclear how much.  He has no chest pain.  He has a chronic cough, but denies shortness of breath.  He has no nausea, vomiting, constipation, or diarrhea.  He has no urinary complaints.  Patient offers no specific complaints today.  REVIEW OF SYSTEMS:   Review of Systems  Constitutional: Positive for weight loss. Negative for fever and malaise/fatigue.  Respiratory: Positive for cough. Negative for hemoptysis and shortness of breath.   Cardiovascular: Negative.  Negative for chest pain and leg swelling.  Gastrointestinal: Negative.  Negative for abdominal pain and constipation.  Genitourinary: Negative.  Negative for dysuria.  Musculoskeletal: Negative.  Negative for back pain.  Skin: Negative.  Negative for rash.  Neurological: Negative.  Negative for focal weakness, weakness and headaches.  Psychiatric/Behavioral: Positive for memory loss. The patient is not nervous/anxious.     As per HPI. Otherwise, a complete review of systems is negative.  PAST MEDICAL HISTORY: Past Medical History:  Diagnosis Date  . Arthritis   . Cancer ([place])    kidney  . COPD (chronic obstructive pulmonary disease) ([place])   . Dysphagia   . Dysrhythmia    A Fib  . GERD (gastroesophageal  reflux disease)   . History of kidney stones   . Hyperlipidemia   . Hypertension   . 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 Vocal cord paralysis     PAST SURGICAL HISTORY: Past Surgical History:  Procedure Laterality Date  . APPENDECTOMY    . DIRECT LARYNGOSCOPY Left 11/24/2017   Procedure: DIRECT LARYNGOSCOPY LEFT VOCAL CORD WITH BIOPSY;  Surgeon: Margaretha Sheffield, MD;  Location: [facility] ORS;  Service: ENT;  Laterality: Left;  . HERNIA REPAIR     abd  . kidney removed     due to cancer  . PEG TUBE PLACEMENT     has been removed  . pleurex cath     has been removed    FAMILY HISTORY: Family History  Problem Relation Age of Onset  . Diabetes Mother   . COPD Mother   . Stroke Sister   . Prostate cancer Brother   . Cancer Maternal Grandmother        type unknown   . Diabetes Brother     ADVANCED DIRECTIVES (Y/N):  N  HEALTH MAINTENANCE: Social History   Tobacco Use  . Smoking status: Current Every Day Smoker    Packs/day: 1.00    Years: 65.00    Pack years: 65.00    Types: Cigarettes  . Smokeless tobacco: Former Systems developer    Types: [place] date: 04/22/1998  . Tobacco comment: went over patches and pills- pt says "patches don't work"  Substance Use Topics  . Alcohol use: Yes  Alcohol/week: 0.6 oz    Types: 1 Standard drinks or equivalent per week    Comment: occassional  . Drug use: No     Colonoscopy:  PAP:  Bone density:  Lipid panel:  No Known Allergies  Current Outpatient Medications  Medication Sig Dispense Refill  . atorvastatin (LIPITOR) 40 MG tablet Take 1 tablet (40 mg total) by mouth daily. (Patient taking differently: Take 40 mg by mouth at bedtime. ) 90 tablet 1  . carbamide peroxide (DEBROX) 6.5 % OTIC solution Place 5 drops into both ears daily as needed (earwax).    . diltiazem (CARDIZEM) 60 MG tablet Take 1 tablet (60 mg total) by mouth 2 (two) times daily. 180 tablet 1  . finasteride (PROSCAR) 5  MG tablet Take 5 mg by mouth at bedtime.    . furosemide (LASIX) 20 MG tablet Take 1 tablet (20 mg total) by mouth daily. 90 tablet 1  . naproxen sodium (ALEVE) 220 MG tablet Take 220 mg by mouth daily as needed.    Marland Kitchen QUEtiapine (SEROQUEL) 25 MG tablet Take 25 mg by mouth 2 (two) times daily.     . ranitidine (ZANTAC) 300 MG tablet Take 1 tablet (300 mg total) by mouth daily. 90 tablet 1  . Senna-Fennel (NATURAL VEGETABLE LAXATIVE PO) Take 1 tablet by mouth daily.    . tamsulosin (FLOMAX) 0.4 MG CAPS capsule Take 1 capsule (0.4 mg total) by mouth daily. 90 capsule 1  . tiZANidine (ZANAFLEX) 2 MG tablet Take 1 mg by mouth at bedtime as needed for muscle spasms.     Marland Kitchen warfarin (COUMADIN) 2 MG tablet TAKE 1 TABLET BY MOUTH ONCE DAILY (Patient taking differently: Take 1 mg by mouth once daily in the evening) 60 tablet 1  . acetaminophen (TYLENOL) 325 MG tablet Take 650 mg by mouth 3 (three) times daily as needed for moderate pain or headache.     Marland Kitchen HYDROcodone-acetaminophen (NORCO/VICODIN) 5-325 MG tablet TAKE 1 TO 2 TABLETS BY MOUTH EVERY 6 HOURS AS NEEDED FOR SEVERE PAIN  0  . nitrofurantoin, macrocrystal-monohydrate, (MACROBID) 100 MG capsule Take 1 capsule (100 mg total) by mouth 2 (two) times daily. (Patient not taking: Reported on 12/05/2017) 14 capsule 0   No current facility-administered medications for this visit.     OBJECTIVE: Vitals:   12/05/17 1139  BP: (!) 95/59  Pulse: (!) 50  Resp: 20  Temp: (!) 96.5 F (35.8 C)     Body mass index is 26.25 kg/m.    ECOG FS:2 - Symptomatic, <50% confined to bed  General: Well-developed, well-nourished, no acute distress. Eyes: Pink conjunctiva, anicteric sclera. HEENT: Normocephalic, moist mucous membranes, clear oropharnyx.  No palpable lymphadenopathy. Lungs: Clear to auscultation bilaterally. Heart: Regular rate and rhythm. No rubs, murmurs, or gallops. Abdomen: Soft, nontender, nondistended. No organomegaly noted, normoactive bowel  sounds. Musculoskeletal: No edema, cyanosis, or clubbing. Neuro: Alert, answering all questions appropriately. Cranial nerves grossly intact. Skin: No rashes or petechiae noted. Psych: Normal affect. Lymphatics: No cervical, calvicular, axillary or inguinal LAD.   LAB RESULTS:  Lab Results  Component Value Date   NA 144 11/24/2017   K 3.9 11/24/2017   CL 104 09/15/2017   CO2 26 09/15/2017   GLUCOSE 88 11/24/2017   BUN 13 09/15/2017   CREATININE 1.44 (H) 09/15/2017   CALCIUM 9.5 09/15/2017   PROT 6.9 08/28/2017   ALBUMIN 3.8 09/15/2017   AST 12 08/28/2017   ALT 11 08/28/2017   ALKPHOS 112 08/28/2017  BILITOT 0.7 08/28/2017   GFRNONAA 46 (L) 09/15/2017   GFRAA 53 (L) 09/15/2017    Lab Results  Component Value Date   WBC 9.3 07/21/2015   NEUTROABS 6.6 (H) 07/21/2015   HGB 16.0 11/24/2017   HCT 47.0 11/24/2017   MCV 93.0 07/21/2015   PLT 224 07/21/2015     STUDIES: Dg Chest 2 View  Result Date: 11/20/2017 CLINICAL DATA:  Preoperative exam for vocal cord biopsy. EXAM: CHEST - 2 VIEW COMPARISON:  07/21/2015 FINDINGS: Heart size is normal. Aortic atherosclerosis as seen previously. Right chest shows a small amount of pleural fluid. On the left, there is chronic pleural and parenchymal scarring with enlargement of a pleural effusions seen previously. No significant bone finding. IMPRESSION: Small bilateral pleural effusions, in the setting of chronic pleural and parenchymal scarring on the left. Electronically Signed   By: Nelson Chimes M.D.   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 Full-ring PET imaging was performed from the skull base to thigh after the radiotracer. CT data was obtained and used for attenuation  correction and anatomic localization. Fasting blood glucose: 97 mg/dl COMPARISON:  MRI lumbar spine 01/25/2016 FINDINGS: Mediastinal blood pool activity: SUV max 2.5 NECK: Left glottic mass with involvement of the anterior commissure and potentially erosion of the inferior margin of the thyroid cartilage, maximum SUV 7.9 compatible with patient's known squamous cell carcinoma. No hypermetabolic or enlarged lymph nodes in the neck. Incidental CT findings: Minimal chronic left maxillary sinusitis. CHEST: Small mediastinal lymph nodes are not pathologically enlarged. There is low-grade activity is along the small left pleural effusion and adjacent thickened pleura, and potentially along the small right basilar pleural effusion, suspicious for exudative etiology. Incidental CT findings: Mild cardiomegaly. Left main and left anterior descending coronary artery atherosclerotic calcification along with atherosclerotic calcification of the aortic arch. Mildly elevated left hemidiaphragm. Notable atelectasis in the left lower lobe. Centrilobular emphysema. 0.6 by 0.5 cm mostly cavitary right upper lobe pulmonary nodule on image 98/3 with relatively thin walls, not appreciably hypermetabolic but below sensitive PET-CT size thresholds. The several small subpleural nodules in the right middle lobe along the major fissure for example on images 124-131 of series 3, not appreciably hypermetabolic but below sensitive PET-CT size thresholds. ABDOMEN/PELVIS: Prostatomegaly with some heterogeneous activity most notably along the right posterior margin where the maximum SUV is 3.9. There is some stranding in the space of Retzius anterior to the urinary bladder with some low-grade activity along the stranding, but no overt nodularity. Incidental CT findings: Right nephrectomy. Mild left hydronephrosis along with left extrarenal pelvis and left hydroureter extending to the distal ureter. Although I do not see a ureteral calculus, there  is a 2.0 cm calculus in the left collecting system along with a 0.3 mm calculus in the left kidney lower pole. The urinary bladder is distended and there is a notable bladder diverticulum or urachal diverticulum along the upper anterior margin of the urinary bladder. I do not see a mass within this diverticulum. Cholelithiasis with an 8 mm gallstone visible in the gallbladder. Aortoiliac atherosclerotic vascular disease. Central mesenteric stranding is chronic, with adipose tissue halo around adjacent lymph nodes suggesting mesenteric panniculitis. Colonic diverticulosis. SKELETON: No significant abnormal hypermetabolic activity in this region.  Incidental CT findings: none IMPRESSION: 1. Left glottic mass compatible with known squamous cell carcinoma. No adenopathy in the neck or definite distal metastatic spread. That noted, there is a 6 by 5 mm mostly cavitary and thin walled right upper lobe pulmonary nodule on image 98/3. This is below sensitive size threshold for PET-CT and merit surveillance. There is also some mild nodularity in the right middle lobe along the major fissure which is probably from small benign subpleural lymph nodes but which may warrant surveillance. 2. Prostatomegaly with some focal accentuated activity along the right posterior peripheral zone about at the level of the mid gland. Correlate with PSA levels in determining need for further workup of the prostate to exclude prostate cancer. No pelvic adenopathy. 3. Left greater than right pleural thickening with small bilateral pleural effusions. Low-grade metabolic activity suggests exudative effusions. Surveillance is likely warranted. 4. Mild left hydronephrosis with dilated left extrarenal pelvis and left hydroureter extending down to the distal ureter. There are left collecting system calculi including a large 2.0 cm calculus, but no distal ureteral calculus to further explain the dilatation. Given the patient's prior right nephrectomy,  urology referral is probably warranted as obstruction or damage to the left kidney has more serious implication. 5. Other imaging findings of potential clinical significance: Minimal chronic left maxillary sinusitis. Mild cardiomegaly. Coronary atherosclerosis. Mildly elevated left hemidiaphragm. Emphysema (ICD10-J43.9). Prostatomegaly. Urachal diverticulum versus Hutch diverticulum of the urinary bladder, anterior superior margin. Right nephrectomy. Cholelithiasis. Chronic mesenteric panniculitis. Colonic diverticulosis. Electronically Signed   By: Van Clines M.D.   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  PLAN:    1. 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  Patient will benefit from concurrent chemotherapy using weekly cisplatin along with daily XRT.  A referral has been made to radiation oncology.  Patient's daughter indicated he may not want to pursue treatment, but encouraged him to keep consultation with radiation oncology.  No follow-up has been scheduled at this time.  Will await radiation oncology input as well as family's final decision on whether or not to pursue treatment. 2.  Renal cell carcinoma: Patient is status post nephrectomy.  If patient pursues treatment, will have to monitor creatinine closely with cisplatin.  I spent a total of 60 minutes face-to-face with the patient of which greater than 50% of the visit was spent in counseling and coordination of care as detailed above.   Patient expressed understanding and was in agreement with this plan. He also understands that He can call clinic at any time with any questions, concerns, or complaints.   Cancer Staging Larynx cancer [facility]) Staging form: Larynx - Supraglottis, AJCC 8th Edition - Clinical stage from 12/11/2017: Stage III (cT3, cN0, cM0) - Signed by  Lloyd Huger, MD on 12/11/2017   Lloyd Huger, MD   12/11/2017 2:11 PM

## 2017-12-16 ENCOUNTER — Ambulatory Visit: Payer: Medicare Other

## 2017-12-22 ENCOUNTER — Other Ambulatory Visit: Payer: Self-pay

## 2017-12-22 DIAGNOSIS — C320 Malignant neoplasm of glottis: Secondary | ICD-10-CM

## 2017-12-23 ENCOUNTER — Other Ambulatory Visit: Payer: Self-pay | Admitting: *Deleted

## 2017-12-23 ENCOUNTER — Ambulatory Visit: Payer: Medicare Other

## 2017-12-23 NOTE — Telephone Encounter (Signed)
error 

## 2017-12-24 ENCOUNTER — Ambulatory Visit: Payer: Medicare Other

## 2017-12-25 ENCOUNTER — Ambulatory Visit: Payer: Medicare Other

## 2017-12-25 ENCOUNTER — Ambulatory Visit: Payer: Medicare Other | Admitting: Gastroenterology

## 2017-12-25 DIAGNOSIS — C320 Malignant neoplasm of glottis: Secondary | ICD-10-CM | POA: Diagnosis not present

## 2017-12-25 DIAGNOSIS — G3109 Other frontotemporal dementia: Secondary | ICD-10-CM | POA: Diagnosis not present

## 2017-12-25 DIAGNOSIS — F17210 Nicotine dependence, cigarettes, uncomplicated: Secondary | ICD-10-CM | POA: Diagnosis not present

## 2017-12-25 DIAGNOSIS — I69391 Dysphagia following cerebral infarction: Secondary | ICD-10-CM | POA: Diagnosis not present

## 2017-12-25 DIAGNOSIS — K219 Gastro-esophageal reflux disease without esophagitis: Secondary | ICD-10-CM | POA: Diagnosis not present

## 2017-12-25 DIAGNOSIS — I69354 Hemiplegia and hemiparesis following cerebral infarction affecting left non-dominant side: Secondary | ICD-10-CM | POA: Diagnosis not present

## 2017-12-25 DIAGNOSIS — I4891 Unspecified atrial fibrillation: Secondary | ICD-10-CM | POA: Diagnosis not present

## 2017-12-25 DIAGNOSIS — M1991 Primary osteoarthritis, unspecified site: Secondary | ICD-10-CM | POA: Diagnosis not present

## 2017-12-25 DIAGNOSIS — I10 Essential (primary) hypertension: Secondary | ICD-10-CM | POA: Diagnosis not present

## 2017-12-25 DIAGNOSIS — J3800 Paralysis of vocal cords and larynx, unspecified: Secondary | ICD-10-CM | POA: Diagnosis not present

## 2017-12-25 DIAGNOSIS — Z9981 Dependence on supplemental oxygen: Secondary | ICD-10-CM | POA: Diagnosis not present

## 2017-12-25 DIAGNOSIS — Z905 Acquired absence of kidney: Secondary | ICD-10-CM | POA: Diagnosis not present

## 2017-12-25 DIAGNOSIS — M4800 Spinal stenosis, site unspecified: Secondary | ICD-10-CM | POA: Diagnosis not present

## 2017-12-25 DIAGNOSIS — Z85528 Personal history of other malignant neoplasm of kidney: Secondary | ICD-10-CM | POA: Diagnosis not present

## 2017-12-25 DIAGNOSIS — J449 Chronic obstructive pulmonary disease, unspecified: Secondary | ICD-10-CM | POA: Diagnosis not present

## 2017-12-25 DIAGNOSIS — R634 Abnormal weight loss: Secondary | ICD-10-CM | POA: Diagnosis not present

## 2017-12-25 DIAGNOSIS — R600 Localized edema: Secondary | ICD-10-CM | POA: Diagnosis not present

## 2017-12-25 DIAGNOSIS — M24542 Contracture, left hand: Secondary | ICD-10-CM | POA: Diagnosis not present

## 2017-12-25 DIAGNOSIS — N400 Enlarged prostate without lower urinary tract symptoms: Secondary | ICD-10-CM | POA: Diagnosis not present

## 2017-12-25 DIAGNOSIS — R1310 Dysphagia, unspecified: Secondary | ICD-10-CM | POA: Diagnosis not present

## 2017-12-26 ENCOUNTER — Other Ambulatory Visit: Payer: Self-pay | Admitting: *Deleted

## 2017-12-26 ENCOUNTER — Ambulatory Visit: Payer: Medicare Other

## 2017-12-26 DIAGNOSIS — J449 Chronic obstructive pulmonary disease, unspecified: Secondary | ICD-10-CM | POA: Diagnosis not present

## 2017-12-26 DIAGNOSIS — C320 Malignant neoplasm of glottis: Secondary | ICD-10-CM | POA: Diagnosis not present

## 2017-12-26 DIAGNOSIS — Z9981 Dependence on supplemental oxygen: Secondary | ICD-10-CM | POA: Diagnosis not present

## 2017-12-26 DIAGNOSIS — I69391 Dysphagia following cerebral infarction: Secondary | ICD-10-CM | POA: Diagnosis not present

## 2017-12-26 DIAGNOSIS — R1310 Dysphagia, unspecified: Secondary | ICD-10-CM | POA: Diagnosis not present

## 2017-12-26 DIAGNOSIS — J3800 Paralysis of vocal cords and larynx, unspecified: Secondary | ICD-10-CM | POA: Diagnosis not present

## 2017-12-26 MED ORDER — HYDROCODONE-ACETAMINOPHEN 5-325 MG PO TABS: ORAL_TABLET | ORAL | 0 refills | Status: AC

## 2017-12-26 MED ORDER — LORAZEPAM 0.5 MG PO TABS: ORAL_TABLET | ORAL | 0 refills | Status: AC

## 2017-12-26 MED ORDER — MORPHINE SULFATE (CONCENTRATE) 20 MG/ML PO SOLN: 5.0000 mg | ORAL | 0 refills | Status: DC | PRN

## 2017-12-27 DIAGNOSIS — J449 Chronic obstructive pulmonary disease, unspecified: Secondary | ICD-10-CM | POA: Diagnosis not present

## 2017-12-27 DIAGNOSIS — J3800 Paralysis of vocal cords and larynx, unspecified: Secondary | ICD-10-CM | POA: Diagnosis not present

## 2017-12-27 DIAGNOSIS — I69391 Dysphagia following cerebral infarction: Secondary | ICD-10-CM | POA: Diagnosis not present

## 2017-12-27 DIAGNOSIS — Z9981 Dependence on supplemental oxygen: Secondary | ICD-10-CM | POA: Diagnosis not present

## 2017-12-27 DIAGNOSIS — R1310 Dysphagia, unspecified: Secondary | ICD-10-CM | POA: Diagnosis not present

## 2017-12-27 DIAGNOSIS — C320 Malignant neoplasm of glottis: Secondary | ICD-10-CM | POA: Diagnosis not present

## 2017-12-29 ENCOUNTER — Ambulatory Visit: Payer: Medicare Other

## 2017-12-29 DIAGNOSIS — I69391 Dysphagia following cerebral infarction: Secondary | ICD-10-CM | POA: Diagnosis not present

## 2017-12-29 DIAGNOSIS — J449 Chronic obstructive pulmonary disease, unspecified: Secondary | ICD-10-CM | POA: Diagnosis not present

## 2017-12-29 DIAGNOSIS — R1310 Dysphagia, unspecified: Secondary | ICD-10-CM | POA: Diagnosis not present

## 2017-12-29 DIAGNOSIS — Z9981 Dependence on supplemental oxygen: Secondary | ICD-10-CM | POA: Diagnosis not present

## 2017-12-29 DIAGNOSIS — C320 Malignant neoplasm of glottis: Secondary | ICD-10-CM | POA: Diagnosis not present

## 2017-12-29 DIAGNOSIS — J3800 Paralysis of vocal cords and larynx, unspecified: Secondary | ICD-10-CM | POA: Diagnosis not present

## 2017-12-29 NOTE — Progress Notes (Deleted)
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  INTERVAL HISTORY: Patient is an 82 year old male who noted increasing raspy voice, hoarseness, and difficulty swallowing.  Subsequent work-up by ENT revealed vocal cord paralysis as well as mass which was confirmed to be malignancy on biopsy.  Much of the history is given by his wife and daughter.  He has no neurologic complaints.  He denies any recent fevers or illnesses.  He has a poor appetite and admits to weight loss, but unclear how much.  He has no chest pain.  He has a chronic cough, but denies shortness of breath.  He has no nausea, vomiting, constipation, or diarrhea.  He has no urinary complaints.  Patient offers no specific complaints today.  REVIEW OF SYSTEMS:   Review of Systems  Constitutional: Positive for weight loss. Negative for fever and malaise/fatigue.  Respiratory: Positive for cough. Negative for hemoptysis and shortness of breath.   Cardiovascular: Negative.  Negative for chest pain and leg swelling.  Gastrointestinal: Negative.  Negative for abdominal pain and constipation.  Genitourinary: Negative.  Negative for dysuria.  Musculoskeletal: Negative.  Negative for back pain.  Skin: Negative.  Negative for rash.  Neurological: Negative.  Negative for focal weakness, weakness and headaches.  Psychiatric/Behavioral: Positive for memory loss. The patient is not nervous/anxious.     As per HPI. Otherwise, a complete review of systems is negative.  PAST MEDICAL HISTORY: Past Medical History:  Diagnosis Date  . Arthritis   . Cancer ([place])    kidney  . COPD (chronic obstructive pulmonary disease) ([place])   . Dysphagia   . Dysrhythmia    A Fib  . GERD (gastroesophageal  reflux disease)   . History of kidney stones   . Hyperlipidemia   . Hypertension   . 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 Vocal cord paralysis     PAST SURGICAL HISTORY: Past Surgical History:  Procedure Laterality Date  . APPENDECTOMY    . DIRECT LARYNGOSCOPY Left 11/24/2017   Procedure: DIRECT LARYNGOSCOPY LEFT VOCAL CORD WITH BIOPSY;  Surgeon: Margaretha Sheffield, MD;  Location: [facility] ORS;  Service: ENT;  Laterality: Left;  . HERNIA REPAIR     abd  . kidney removed     due to cancer  . PEG TUBE PLACEMENT     has been removed  . pleurex cath     has been removed    FAMILY HISTORY: Family History  Problem Relation Age of Onset  . Diabetes Mother   . COPD Mother   . Stroke Sister   . Prostate cancer Brother   . Cancer Maternal Grandmother        type unknown   . Diabetes Brother     ADVANCED DIRECTIVES (Y/N):  N  HEALTH MAINTENANCE: Social History   Tobacco Use  . Smoking status: Current Every Day Smoker    Packs/day: 1.00    Years: 65.00    Pack years: 65.00    Types: Cigarettes  . Smokeless tobacco: Former Systems developer    Types: [place] date: 04/22/1998  . Tobacco comment: went over patches and pills- pt says "patches don't work"  Substance Use Topics  . Alcohol use: Yes  Alcohol/week: 0.6 oz    Types: 1 Standard drinks or equivalent per week    Comment: occassional  . Drug use: No     Colonoscopy:  PAP:  Bone density:  Lipid panel:  No Known Allergies  Current Outpatient Medications  Medication Sig Dispense Refill  . acetaminophen (TYLENOL) 325 MG tablet Take 650 mg by mouth 3 (three) times daily as needed for moderate pain or headache.     Marland Kitchen atorvastatin (LIPITOR) 40 MG tablet Take 1 tablet (40 mg total) by mouth daily. (Patient taking differently: Take 40 mg by mouth at bedtime. ) 90 tablet 1  . carbamide peroxide (DEBROX) 6.5 % OTIC solution Place 5 drops into both ears daily as needed (earwax).    .  diltiazem (CARDIZEM) 60 MG tablet Take 1 tablet (60 mg total) by mouth 2 (two) times daily. 180 tablet 1  . finasteride (PROSCAR) 5 MG tablet Take 5 mg by mouth at bedtime.    . furosemide (LASIX) 20 MG tablet Take 1 tablet (20 mg total) by mouth daily. 90 tablet 1  . HYDROcodone-acetaminophen (NORCO/VICODIN) 5-325 MG tablet TAKE 1 TO 2 TABLETS BY MOUTH EVERY 6 HOURS AS NEEDED FOR SEVERE PAIN 90 tablet 0  . LORazepam (ATIVAN) 0.5 MG tablet 0.25 to 0.5 mg po every 4 hours as needed 60 tablet 0  . morphine (ROXANOL) 20 MG/ML concentrated solution Take 0.25-1 mLs (5-20 mg total) by mouth every hour as needed for shortness of breath (pain). 30 mL 0  . naproxen sodium (ALEVE) 220 MG tablet Take 220 mg by mouth daily as needed.    . nitrofurantoin, macrocrystal-monohydrate, (MACROBID) 100 MG capsule Take 1 capsule (100 mg total) by mouth 2 (two) times daily. (Patient not taking: Reported on 12/05/2017) 14 capsule 0  . ondansetron (ZOFRAN) 8 MG tablet Take 1 tablet (8 mg total) by mouth 2 (two) times daily as needed. 30 tablet 2  . prochlorperazine (COMPAZINE) 10 MG tablet Take 1 tablet (10 mg total) by mouth every 6 (six) hours as needed (Nausea or vomiting). 30 tablet 1  . QUEtiapine (SEROQUEL) 25 MG tablet Take 25 mg by mouth 2 (two) times daily.     . ranitidine (ZANTAC) 300 MG tablet Take 1 tablet (300 mg total) by mouth daily. 90 tablet 1  . Senna-Fennel (NATURAL VEGETABLE LAXATIVE PO) Take 1 tablet by mouth daily.    . tamsulosin (FLOMAX) 0.4 MG CAPS capsule Take 1 capsule (0.4 mg total) by mouth daily. 90 capsule 1  . tiZANidine (ZANAFLEX) 2 MG tablet Take 1 mg by mouth at bedtime as needed for muscle spasms.     Marland Kitchen warfarin (COUMADIN) 2 MG tablet TAKE 1 TABLET BY MOUTH ONCE DAILY (Patient taking differently: Take 1 mg by mouth once daily in the evening) 60 tablet 1   No current facility-administered medications for this visit.     OBJECTIVE: There were no vitals filed for this visit.   There is  no height or weight on file to calculate BMI.    ECOG FS:2 - Symptomatic, <50% confined to bed  General: Well-developed, well-nourished, no acute distress. Eyes: Pink conjunctiva, anicteric sclera. HEENT: Normocephalic, moist mucous membranes, clear oropharnyx.  No palpable lymphadenopathy. Lungs: Clear to auscultation bilaterally. Heart: Regular rate and rhythm. No rubs, murmurs, or gallops. Abdomen: Soft, nontender, nondistended. No organomegaly noted, normoactive bowel sounds. Musculoskeletal: No edema, cyanosis, or clubbing. Neuro: Alert, answering all questions appropriately. Cranial nerves grossly intact. Skin: No rashes or petechiae noted. Psych: Normal  affect. Lymphatics: No cervical, calvicular, axillary or inguinal LAD.   LAB RESULTS:  Lab Results  Component Value Date   NA 144 11/24/2017   K 3.9 11/24/2017   CL 104 09/15/2017   CO2 26 09/15/2017   GLUCOSE 88 11/24/2017   BUN 13 09/15/2017   CREATININE 1.44 (H) 09/15/2017   CALCIUM 9.5 09/15/2017   PROT 6.9 08/28/2017   ALBUMIN 3.8 09/15/2017   AST 12 08/28/2017   ALT 11 08/28/2017   ALKPHOS 112 08/28/2017   BILITOT 0.7 08/28/2017   GFRNONAA 46 (L) 09/15/2017   GFRAA 53 (L) 09/15/2017    Lab Results  Component Value Date   WBC 9.3 07/21/2015   NEUTROABS 6.6 (H) 07/21/2015   HGB 16.0 11/24/2017   HCT 47.0 11/24/2017   MCV 93.0 07/21/2015   PLT 224 07/21/2015     STUDIES: Nm Pet Image Initial (pi) Skull Base To Thigh  Result Date: 12/02/2017 CLINICAL DATA:  Initial treatment strategy for 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 Full-ring PET imaging was performed from the skull base to thigh after the radiotracer. CT data was obtained and used for attenuation correction and anatomic localization. Fasting blood glucose: 97 mg/dl COMPARISON:  MRI lumbar spine 01/25/2016 FINDINGS: Mediastinal  blood pool activity: SUV max 2.5 NECK: Left glottic mass with involvement of the anterior commissure and potentially erosion of the inferior margin of the thyroid cartilage, maximum SUV 7.9 compatible with patient's known squamous cell carcinoma. No hypermetabolic or enlarged lymph nodes in the neck. Incidental CT findings: Minimal chronic left maxillary sinusitis. CHEST: Small mediastinal lymph nodes are not pathologically enlarged. There is low-grade activity is along the small left pleural effusion and adjacent thickened pleura, and potentially along the small right basilar pleural effusion, suspicious for exudative etiology. Incidental CT findings: Mild cardiomegaly. Left main and left anterior descending coronary artery atherosclerotic calcification along with atherosclerotic calcification of the aortic arch. Mildly elevated left hemidiaphragm. Notable atelectasis in the left lower lobe. Centrilobular emphysema. 0.6 by 0.5 cm mostly cavitary right upper lobe pulmonary nodule on image 98/3 with relatively thin walls, not appreciably hypermetabolic but below sensitive PET-CT size thresholds. The several small subpleural nodules in the right middle lobe along the major fissure for example on images 124-131 of series 3, not appreciably hypermetabolic but below sensitive PET-CT size thresholds. ABDOMEN/PELVIS: Prostatomegaly with some heterogeneous activity most notably along the right posterior margin where the maximum SUV is 3.9. There is some stranding in the space of Retzius anterior to the urinary bladder with some low-grade activity along the stranding, but no overt nodularity. Incidental CT findings: Right nephrectomy. Mild left hydronephrosis along with left extrarenal pelvis and left hydroureter extending to the distal ureter. Although I do not see a ureteral calculus, there is a 2.0 cm calculus in the left collecting system along with a 0.3 mm calculus in the left kidney lower pole. The urinary bladder is  distended and there is a notable bladder diverticulum or urachal diverticulum along the upper anterior margin of the urinary bladder. I do not see a mass within this diverticulum. Cholelithiasis with an 8 mm gallstone visible in the gallbladder. Aortoiliac atherosclerotic vascular disease. Central mesenteric stranding is chronic, with adipose tissue halo around adjacent lymph nodes suggesting mesenteric panniculitis. Colonic diverticulosis. SKELETON: No significant abnormal hypermetabolic activity in this region. Incidental CT findings: none IMPRESSION: 1. Left glottic mass compatible with  known squamous cell carcinoma. No adenopathy in the neck or definite distal metastatic spread. That noted, there is a 6 by 5 mm mostly cavitary and thin walled right upper lobe pulmonary nodule on image 98/3. This is below sensitive size threshold for PET-CT and merit surveillance. There is also some mild nodularity in the right middle lobe along the major fissure which is probably from small benign subpleural lymph nodes but which may warrant surveillance. 2. Prostatomegaly with some focal accentuated activity along the right posterior peripheral zone about at the level of the mid gland. Correlate with PSA levels in determining need for further workup of the prostate to exclude prostate cancer. No pelvic adenopathy. 3. Left greater than right pleural thickening with small bilateral pleural effusions. Low-grade metabolic activity suggests exudative effusions. Surveillance is likely warranted. 4. Mild left hydronephrosis with dilated left extrarenal pelvis and left hydroureter extending down to the distal ureter. There are left collecting system calculi including a large 2.0 cm calculus, but no distal ureteral calculus to further explain the dilatation. Given the patient's prior right nephrectomy, urology referral is probably warranted as obstruction or damage to the left kidney has more serious implication. 5. Other imaging  findings of potential clinical significance: Minimal chronic left maxillary sinusitis. Mild cardiomegaly. Coronary atherosclerosis. Mildly elevated left hemidiaphragm. Emphysema (ICD10-J43.9). Prostatomegaly. Urachal diverticulum versus Hutch diverticulum of the urinary bladder, anterior superior margin. Right nephrectomy. Cholelithiasis. Chronic mesenteric panniculitis. Colonic diverticulosis. Electronically Signed   By: Van Clines M.D.   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  PLAN:    1. 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  Patient will benefit from concurrent chemotherapy using weekly cisplatin along with daily XRT.  A referral has been made to radiation oncology.  Patient's daughter indicated he may not want to pursue treatment, but encouraged him to keep consultation with radiation oncology.  No follow-up has been scheduled at this time.  Will await radiation oncology input as well as family's final decision on whether or not to pursue treatment. 2.  Renal cell carcinoma: Patient is status post nephrectomy.  If patient pursues treatment, will have to monitor creatinine closely with cisplatin.  I spent a total of 60 minutes face-to-face with the patient of which greater than 50% of the visit was spent in counseling and coordination of care as detailed above.   Patient expressed understanding and was in agreement with this plan. He also understands that He can call clinic at any time with any questions, concerns, or complaints.   Cancer Staging Larynx cancer [facility]) Staging form: Larynx - Supraglottis, AJCC 8th Edition - Clinical stage from 12/11/2017: Stage III (cT3, cN0, cM0) - Signed by Lloyd Huger, MD on 12/11/2017   Lloyd Huger, MD   12/29/2017 1:50 PM

## 2017-12-30 ENCOUNTER — Ambulatory Visit: Payer: Medicare Other

## 2017-12-30 DIAGNOSIS — J3800 Paralysis of vocal cords and larynx, unspecified: Secondary | ICD-10-CM | POA: Diagnosis not present

## 2017-12-30 DIAGNOSIS — C320 Malignant neoplasm of glottis: Secondary | ICD-10-CM | POA: Diagnosis not present

## 2017-12-30 DIAGNOSIS — R1310 Dysphagia, unspecified: Secondary | ICD-10-CM | POA: Diagnosis not present

## 2017-12-30 DIAGNOSIS — Z9981 Dependence on supplemental oxygen: Secondary | ICD-10-CM | POA: Diagnosis not present

## 2017-12-30 DIAGNOSIS — I69391 Dysphagia following cerebral infarction: Secondary | ICD-10-CM | POA: Diagnosis not present

## 2017-12-30 DIAGNOSIS — J449 Chronic obstructive pulmonary disease, unspecified: Secondary | ICD-10-CM | POA: Diagnosis not present

## 2017-12-31 ENCOUNTER — Inpatient Hospital Stay: Payer: Medicare Other | Admitting: Oncology

## 2017-12-31 ENCOUNTER — Ambulatory Visit: Payer: Medicare Other

## 2017-12-31 ENCOUNTER — Telehealth: Payer: Self-pay | Admitting: *Deleted

## 2017-12-31 ENCOUNTER — Other Ambulatory Visit: Payer: Medicare Other

## 2017-12-31 DIAGNOSIS — J449 Chronic obstructive pulmonary disease, unspecified: Secondary | ICD-10-CM | POA: Diagnosis not present

## 2017-12-31 DIAGNOSIS — I69391 Dysphagia following cerebral infarction: Secondary | ICD-10-CM | POA: Diagnosis not present

## 2017-12-31 DIAGNOSIS — Z9981 Dependence on supplemental oxygen: Secondary | ICD-10-CM | POA: Diagnosis not present

## 2017-12-31 DIAGNOSIS — J3800 Paralysis of vocal cords and larynx, unspecified: Secondary | ICD-10-CM | POA: Diagnosis not present

## 2017-12-31 DIAGNOSIS — C320 Malignant neoplasm of glottis: Secondary | ICD-10-CM | POA: Diagnosis not present

## 2017-12-31 DIAGNOSIS — R1310 Dysphagia, unspecified: Secondary | ICD-10-CM | POA: Diagnosis not present

## 2017-12-31 NOTE — Telephone Encounter (Signed)
Hospice called to report that patient would not make his appointment today, he is actively dying and has hemoptysis

## 2018-01-01 ENCOUNTER — Ambulatory Visit: Payer: Medicare Other

## 2018-01-01 ENCOUNTER — Other Ambulatory Visit: Payer: Self-pay | Admitting: *Deleted

## 2018-01-01 DIAGNOSIS — Z9981 Dependence on supplemental oxygen: Secondary | ICD-10-CM | POA: Diagnosis not present

## 2018-01-01 DIAGNOSIS — I69391 Dysphagia following cerebral infarction: Secondary | ICD-10-CM | POA: Diagnosis not present

## 2018-01-01 DIAGNOSIS — J449 Chronic obstructive pulmonary disease, unspecified: Secondary | ICD-10-CM | POA: Diagnosis not present

## 2018-01-01 DIAGNOSIS — C320 Malignant neoplasm of glottis: Secondary | ICD-10-CM | POA: Diagnosis not present

## 2018-01-01 DIAGNOSIS — J3800 Paralysis of vocal cords and larynx, unspecified: Secondary | ICD-10-CM | POA: Diagnosis not present

## 2018-01-01 DIAGNOSIS — R1310 Dysphagia, unspecified: Secondary | ICD-10-CM | POA: Diagnosis not present

## 2018-01-01 MED ORDER — MORPHINE SULFATE (CONCENTRATE) 20 MG/ML PO SOLN: 5.0000 mg | ORAL | 0 refills | Status: AC | PRN

## 2018-01-02 ENCOUNTER — Ambulatory Visit: Payer: Medicare Other

## 2018-01-02 DIAGNOSIS — J3800 Paralysis of vocal cords and larynx, unspecified: Secondary | ICD-10-CM | POA: Diagnosis not present

## 2018-01-02 DIAGNOSIS — I69391 Dysphagia following cerebral infarction: Secondary | ICD-10-CM | POA: Diagnosis not present

## 2018-01-02 DIAGNOSIS — R1310 Dysphagia, unspecified: Secondary | ICD-10-CM | POA: Diagnosis not present

## 2018-01-02 DIAGNOSIS — Z9981 Dependence on supplemental oxygen: Secondary | ICD-10-CM | POA: Diagnosis not present

## 2018-01-02 DIAGNOSIS — J449 Chronic obstructive pulmonary disease, unspecified: Secondary | ICD-10-CM | POA: Diagnosis not present

## 2018-01-02 DIAGNOSIS — C320 Malignant neoplasm of glottis: Secondary | ICD-10-CM | POA: Diagnosis not present

## 2018-01-05 ENCOUNTER — Ambulatory Visit: Payer: Medicare Other

## 2018-01-06 ENCOUNTER — Ambulatory Visit: Payer: Medicare Other

## 2018-01-07 ENCOUNTER — Ambulatory Visit: Payer: Medicare Other

## 2018-01-08 ENCOUNTER — Ambulatory Visit: Payer: Medicare Other

## 2018-01-09 ENCOUNTER — Ambulatory Visit: Payer: Medicare Other

## 2018-01-12 ENCOUNTER — Ambulatory Visit: Payer: Medicare Other

## 2018-01-13 ENCOUNTER — Ambulatory Visit: Payer: Medicare Other

## 2018-01-14 ENCOUNTER — Ambulatory Visit: Payer: Medicare Other

## 2018-01-15 ENCOUNTER — Ambulatory Visit: Payer: Medicare Other

## 2018-01-16 ENCOUNTER — Ambulatory Visit: Payer: Medicare Other

## 2018-01-19 ENCOUNTER — Ambulatory Visit: Payer: Medicare Other

## 2018-01-20 ENCOUNTER — Ambulatory Visit: Payer: Medicare Other

## 2018-01-21 ENCOUNTER — Ambulatory Visit: Payer: Medicare Other

## 2018-01-22 ENCOUNTER — Ambulatory Visit: Payer: Medicare Other

## 2018-01-23 ENCOUNTER — Ambulatory Visit: Payer: Medicare Other

## 2018-01-25 DEATH — deceased

## 2018-01-27 ENCOUNTER — Ambulatory Visit: Payer: Medicare Other

## 2018-01-28 ENCOUNTER — Ambulatory Visit: Payer: Medicare Other

## 2018-01-29 ENCOUNTER — Ambulatory Visit: Payer: Medicare Other

## 2018-01-30 ENCOUNTER — Ambulatory Visit: Payer: Medicare Other

## 2018-02-02 ENCOUNTER — Ambulatory Visit: Payer: Medicare Other

## 2018-02-03 ENCOUNTER — Ambulatory Visit: Payer: Medicare Other

## 2018-02-04 ENCOUNTER — Ambulatory Visit: Payer: Medicare Other

## 2018-02-05 ENCOUNTER — Ambulatory Visit: Payer: Medicare Other

## 2018-02-06 ENCOUNTER — Ambulatory Visit: Payer: Medicare Other

## 2018-02-09 ENCOUNTER — Ambulatory Visit: Payer: Medicare Other

## 2018-02-10 ENCOUNTER — Ambulatory Visit: Payer: Medicare Other

## 2018-02-11 ENCOUNTER — Ambulatory Visit: Payer: Medicare Other

## 2019-06-24 IMAGING — PT NM PET TUM IMG INITIAL (PI) SKULL BASE T - THIGH
1 of 9 series · 1 of 25 positions shown · non-contrast
Comparison: MRI lumbar spine 01/25/2016

CLINICAL DATA: Initial treatment strategy for invasive squamous
cell carcinoma of the left vocal cord

EXAM:
NUCLEAR MEDICINE PET SKULL BASE TO THIGH
TECHNIQUE: 8.7 mCi F-18 FDG was injected intravenously. Full-ring PET imaging
was performed from the skull base to thigh after the radiotracer. CT
data was obtained and used for attenuation correction and anatomic
localization.
Fasting blood glucose: 97 mg/dl

[Series 3: ct wb 5.0 b30f · axial · 5.0mm · 0.98mm/px · 1 of 329 slices shown]
[im 329/329  brain]
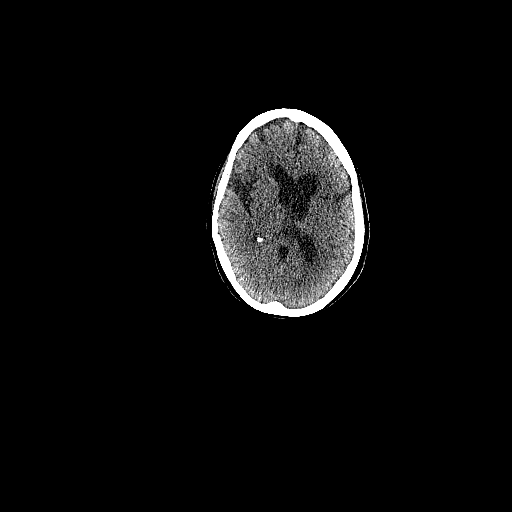

[1 of 25 positions shown; findings below may reference images not displayed]

FINDINGS: Mediastinal blood pool activity: SUV max

NECK: Left glottic mass with involvement of the anterior commissure
and potentially erosion of the inferior margin of the thyroid
cartilage, maximum SUV 7.9 compatible with patient's known squamous
cell carcinoma. No hypermetabolic or enlarged lymph nodes in the
neck.

Incidental CT findings: Minimal chronic left maxillary sinusitis.

CHEST: Small mediastinal lymph nodes are not pathologically
enlarged. There is low-grade activity is along the small left
pleural effusion and adjacent thickened pleura, and potentially
along the small right basilar pleural effusion, suspicious for
exudative etiology.

Incidental CT findings: Mild cardiomegaly. Left main and left
anterior descending coronary artery atherosclerotic calcification
along with atherosclerotic calcification of the aortic arch. Mildly
elevated left hemidiaphragm. Notable atelectasis in the left lower
lobe. Centrilobular emphysema. 0.6 by 0.5 cm mostly cavitary right
upper lobe pulmonary nodule on image 98/3 with relatively thin
walls, not appreciably hypermetabolic but below sensitive PET-CT
size thresholds. The several small subpleural nodules in the right
middle lobe along the major fissure for example on images 124-131 of
series 3, not appreciably hypermetabolic but below sensitive PET-CT
size thresholds.

ABDOMEN/PELVIS: Prostatomegaly with some heterogeneous activity most
notably along the right posterior margin where the maximum SUV is
3.9.

There is some stranding in the space of Retzius anterior to the
urinary bladder with some low-grade activity along the stranding,
but no overt nodularity.

Incidental CT findings: Right nephrectomy. Mild left hydronephrosis
along with left extrarenal pelvis and left hydroureter extending to
the distal ureter. Although I do not see a ureteral calculus, there
is a 2.0 cm calculus in the left collecting system along with a
mm calculus in the left kidney lower pole. The urinary bladder is
distended and there is a notable bladder diverticulum or urachal
diverticulum along the upper anterior margin of the urinary bladder.
I do not see a mass within this diverticulum.

Cholelithiasis with an 8 mm gallstone visible in the gallbladder.
Aortoiliac atherosclerotic vascular disease.

Central mesenteric stranding is chronic, with adipose tissue halo
around adjacent lymph nodes suggesting mesenteric panniculitis.

Colonic diverticulosis.

SKELETON: No significant abnormal hypermetabolic activity in this
region.

Incidental CT findings: none
IMPRESSION: 1. Left glottic mass compatible with known squamous cell carcinoma.
No adenopathy in the neck or definite distal metastatic spread. That
noted, there is a 6 by 5 mm mostly cavitary and thin walled right
upper lobe pulmonary nodule on image 98/3. This is below sensitive
size threshold for PET-CT and merit surveillance. There is also some
mild nodularity in the right middle lobe along the major fissure
which is probably from small benign subpleural lymph nodes but which
may warrant surveillance.
2. Prostatomegaly with some focal accentuated activity along the
right posterior peripheral zone about at the level of the mid gland.
Correlate with PSA levels in determining need for further workup of
the prostate to exclude prostate cancer. No pelvic adenopathy.
3. Left greater than right pleural thickening with small bilateral
pleural effusions. Low-grade metabolic activity suggests exudative
effusions. Surveillance is likely warranted.
4. Mild left hydronephrosis with dilated left extrarenal pelvis and
left hydroureter extending down to the distal ureter. There are left
collecting system calculi including a large 2.0 cm calculus, but no
distal ureteral calculus to further explain the dilatation. Given
the patient's prior right nephrectomy, urology referral is probably
warranted as obstruction or damage to the left kidney has more
serious implication.
5. Other imaging findings of potential clinical significance:
Minimal chronic left maxillary sinusitis. Mild cardiomegaly.
Coronary atherosclerosis. Mildly elevated left hemidiaphragm.
Emphysema ([id number]). Prostatomegaly. Urachal diverticulum versus
[place] diverticulum of the urinary bladder, anterior superior margin.
Right nephrectomy. Cholelithiasis. Chronic mesenteric panniculitis.
Colonic diverticulosis.
